# Patient Record
Sex: Female | Born: 1995
Health system: Southern US, Community
[De-identification: ages and names within clinical notes are randomized; demographics above are authoritative.]

## PROBLEM LIST (undated history)

## (undated) DIAGNOSIS — J329 Chronic sinusitis, unspecified: Secondary | ICD-10-CM

## (undated) DIAGNOSIS — K589 Irritable bowel syndrome without diarrhea: Secondary | ICD-10-CM

## (undated) DIAGNOSIS — L309 Dermatitis, unspecified: Secondary | ICD-10-CM

## (undated) DIAGNOSIS — J189 Pneumonia, unspecified organism: Secondary | ICD-10-CM

## (undated) DIAGNOSIS — F419 Anxiety disorder, unspecified: Secondary | ICD-10-CM

## (undated) HISTORY — DX: Chronic sinusitis, unspecified: J32.9

## (undated) HISTORY — DX: Dermatitis, unspecified: L30.9

## (undated) HISTORY — PX: MOUTH SURGERY: SHX715

## (undated) HISTORY — DX: Anxiety disorder, unspecified: F41.9

## (undated) HISTORY — DX: Irritable bowel syndrome, unspecified: K58.9

## (undated) HISTORY — DX: Pneumonia, unspecified organism: J18.9

---
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
## 2013-02-01 ENCOUNTER — Other Ambulatory Visit: Payer: Self-pay | Admitting: Family Medicine

## 2013-02-01 NOTE — Telephone Encounter (Signed)
IS THIS OK 

## 2013-08-01 ENCOUNTER — Ambulatory Visit: Payer: Self-pay | Admitting: Family Medicine

## 2013-08-10 ENCOUNTER — Ambulatory Visit: Payer: Self-pay | Admitting: Family Medicine

## 2013-09-06 ENCOUNTER — Encounter: Payer: Self-pay | Admitting: Family Medicine

## 2013-09-15 ENCOUNTER — Other Ambulatory Visit: Payer: Self-pay | Admitting: Family Medicine

## 2013-10-12 ENCOUNTER — Other Ambulatory Visit: Payer: Self-pay | Admitting: Family Medicine

## 2014-01-08 ENCOUNTER — Ambulatory Visit (INDEPENDENT_AMBULATORY_CARE_PROVIDER_SITE_OTHER): Payer: BC Managed Care – PPO | Admitting: Family Medicine

## 2014-01-08 ENCOUNTER — Encounter: Payer: Self-pay | Admitting: Family Medicine

## 2014-01-08 VITALS — BP 110/70 | HR 68 | Ht 65.5 in | Wt 138.0 lb

## 2014-01-08 DIAGNOSIS — Z0000 Encounter for general adult medical examination without abnormal findings: Secondary | ICD-10-CM

## 2014-01-08 DIAGNOSIS — Z23 Encounter for immunization: Secondary | ICD-10-CM

## 2014-01-08 DIAGNOSIS — F410 Panic disorder [episodic paroxysmal anxiety] without agoraphobia: Secondary | ICD-10-CM

## 2014-01-08 MED ORDER — ESCITALOPRAM OXALATE 20 MG PO TABS: ORAL_TABLET | ORAL | Status: DC

## 2014-01-09 ENCOUNTER — Encounter: Payer: Self-pay | Admitting: Family Medicine

## 2014-01-09 NOTE — Progress Notes (Signed)
   Subjective:    Patient ID: Amy Velasquez, female    DOB: 1996-02-05, 18 y.o.   MRN: [id number]  HPI She is here for a complete examination. She presently is on birth control and doing well on this. She is in college and has a school sponsored trip to [place] where she will be doing backpacking. She is really looking forward to this. She continues on Lexapro. She did try decreasing her Lexapro but after several months found that her anxiety had returned and she is now back on 20 mg and seems to be doing quite nicely on this. She has no other concerns or complaints.   Review of Systems  All other systems reviewed and are negative.      Objective:   Physical Exam alert and in no distress. Tympanic membranes and canals are normal. Throat is clear. Tonsils are normal. Neck is supple without adenopathy or thyromegaly. Cardiac exam shows a regular sinus rhythm without murmurs or gallops. Lungs are clear to auscultation. Abdominal exam shows no masses or tenderness with normal bowel sounds      Assessment & Plan:  Panic disorder - Plan: escitalopram (LEXAPRO) 20 MG tablet  Need for prophylactic vaccination and inoculation against viral hepatitis - Plan: Hepatitis A vaccine adult IM  I will continue her on her Lexapro for the time being. Also discussed her trip in regard to immunizations. She will be given a hepatitis A. She will followup with her school in regard to other immunizations that she might need.

## 2014-04-08 ENCOUNTER — Other Ambulatory Visit: Payer: Self-pay | Admitting: Family Medicine

## 2014-10-19 ENCOUNTER — Other Ambulatory Visit: Payer: Self-pay | Admitting: Family Medicine

## 2014-11-14 ENCOUNTER — Ambulatory Visit (INDEPENDENT_AMBULATORY_CARE_PROVIDER_SITE_OTHER): Payer: BLUE CROSS/BLUE SHIELD | Admitting: Family Medicine

## 2014-11-14 VITALS — BP 90/60 | HR 86 | Temp 98.5°F | Wt 143.0 lb

## 2014-11-14 DIAGNOSIS — S060X0A Concussion without loss of consciousness, initial encounter: Secondary | ICD-10-CM

## 2014-11-14 NOTE — Patient Instructions (Signed)
Concussion A concussion, or closed-head injury, is a brain injury caused by a direct blow to the head or by a quick and sudden movement (jolt) of the head or neck. Concussions are usually not life-threatening. Even so, the effects of a concussion can be serious. If you have had a concussion before, you are more likely to experience concussion-like symptoms after a direct blow to the head.  CAUSES  Direct blow to the head, such as from running into another player during a soccer game, being hit in a fight, or hitting your head on a hard surface.  A jolt of the head or neck that causes the brain to move back and forth inside the skull, such as in a car crash. SIGNS AND SYMPTOMS The signs of a concussion can be hard to notice. Early on, they may be missed by you, family members, and health care providers. You may look fine but act or feel differently. Symptoms are usually temporary, but they may last for days, weeks, or even longer. Some symptoms may appear right away while others may not show up for hours or days. Every head injury is different. Symptoms include:  Mild to moderate headaches that will not go away.  A feeling of pressure inside your head.  Having more trouble than usual:  Learning or remembering things you have heard.  Answering questions.  Paying attention or concentrating.  Organizing daily tasks.  Making decisions and solving problems.  Slowness in thinking, acting or reacting, speaking, or reading.  Getting lost or being easily confused.  Feeling tired all the time or lacking energy (fatigued).  Feeling drowsy.  Sleep disturbances.  Sleeping more than usual.  Sleeping less than usual.  Trouble falling asleep.  Trouble sleeping (insomnia).  Loss of balance or feeling lightheaded or dizzy.  Nausea or vomiting.  Numbness or tingling.  Increased sensitivity to:  Sounds.  Lights.  Distractions.  Vision problems or eyes that tire  easily.  Diminished sense of taste or smell.  Ringing in the ears.  Mood changes such as feeling sad or anxious.  Becoming easily irritated or angry for little or no reason.  Lack of motivation.  Seeing or hearing things other people do not see or hear (hallucinations). DIAGNOSIS Your health care provider can usually diagnose a concussion based on a description of your injury and symptoms. He or she will ask whether you passed out (lost consciousness) and whether you are having trouble remembering events that happened right before and during your injury. Your evaluation might include:  A brain scan to look for signs of injury to the brain. Even if the test shows no injury, you may still have a concussion.  Blood tests to be sure other problems are not present. TREATMENT  Concussions are usually treated in an emergency department, in urgent care, or at a clinic. You may need to stay in the hospital overnight for further treatment.  Tell your health care provider if you are taking any medicines, including prescription medicines, over-the-counter medicines, and natural remedies. Some medicines, such as blood thinners (anticoagulants) and aspirin, may increase the chance of complications. Also tell your health care provider whether you have had alcohol or are taking illegal drugs. This information may affect treatment.  Your health care provider will send you home with important instructions to follow.  How fast you will recover from a concussion depends on many factors. These factors include how severe your concussion is, what part of your brain was injured, your  age, and how healthy you were before the concussion.  Most people with mild injuries recover fully. Recovery can take time. In general, recovery is slower in older persons. Also, persons who have had a concussion in the past or have other medical problems may find that it takes longer to recover from their current injury. HOME  CARE INSTRUCTIONS General Instructions  Carefully follow the directions your health care provider gave you.  Only take over-the-counter or prescription medicines for pain, discomfort, or fever as directed by your health care provider.  Take only those medicines that your health care provider has approved.  Do not drink alcohol until your health care provider says you are well enough to do so. Alcohol and certain other drugs may slow your recovery and can put you at risk of further injury.  If it is harder than usual to remember things, write them down.  If you are easily distracted, try to do one thing at a time. For example, do not try to watch TV while fixing dinner.  Talk with family members or close friends when making important decisions.  Keep all follow-up appointments. Repeated evaluation of your symptoms is recommended for your recovery.  Watch your symptoms and tell others to do the same. Complications sometimes occur after a concussion. Older adults with a brain injury may have a higher risk of serious complications, such as a blood clot on the brain.  Tell your teachers, school nurse, school counselor, coach, athletic trainer, or work Freight forwarder about your injury, symptoms, and restrictions. Tell them about what you can or cannot do. They should watch for:  Increased problems with attention or concentration.  Increased difficulty remembering or learning new information.  Increased time needed to complete tasks or assignments.  Increased irritability or decreased ability to cope with stress.  Increased symptoms.  Rest. Rest helps the brain to heal. Make sure you:  Get plenty of sleep at night. Avoid staying up late at night.  Keep the same bedtime hours on weekends and weekdays.  Rest during the day. Take daytime naps or rest breaks when you feel tired.  Limit activities that require a lot of thought or concentration. These include:  Doing homework or job-related  work.  Watching TV.  Working on the computer.  Avoid any situation where there is potential for another head injury (football, hockey, soccer, basketball, martial arts, downhill snow sports and horseback riding). Your condition will get worse every time you experience a concussion. You should avoid these activities until you are evaluated by the appropriate follow-up health care providers. Returning To Your Regular Activities You will need to return to your normal activities slowly, not all at once. You must give your body and brain enough time for recovery.  Do not return to sports or other athletic activities until your health care provider tells you it is safe to do so.  Ask your health care provider when you can drive, ride a bicycle, or operate heavy machinery. Your ability to react may be slower after a brain injury. Never do these activities if you are dizzy.  Ask your health care provider about when you can return to work or school. Preventing Another Concussion It is very important to avoid another brain injury, especially before you have recovered. In rare cases, another injury can lead to permanent brain damage, brain swelling, or death. The risk of this is greatest during the first 7-10 days after a head injury. Avoid injuries by:  Wearing a seat  belt when riding in a car.  Drinking alcohol only in moderation.  Wearing a helmet when biking, skiing, skateboarding, skating, or doing similar activities.  Avoiding activities that could lead to a second concussion, such as contact or recreational sports, until your health care provider says it is okay.  Taking safety measures in your home.  Remove clutter and tripping hazards from floors and stairways.  Use grab bars in bathrooms and handrails by stairs.  Place non-slip mats on floors and in bathtubs.  Improve lighting in dim areas. SEEK MEDICAL CARE IF:  You have increased problems paying attention or  concentrating.  You have increased difficulty remembering or learning new information.  You need more time to complete tasks or assignments than before.  You have increased irritability or decreased ability to cope with stress.  You have more symptoms than before. Seek medical care if you have any of the following symptoms for more than 2 weeks after your injury:  Lasting (chronic) headaches.  Dizziness or balance problems.  Nausea.  Vision problems.  Increased sensitivity to noise or light.  Depression or mood swings.  Anxiety or irritability.  Memory problems.  Difficulty concentrating or paying attention.  Sleep problems.  Feeling tired all the time. SEEK IMMEDIATE MEDICAL CARE IF:  You have severe or worsening headaches. These may be a sign of a blood clot in the brain.  You have weakness (even if only in one hand, leg, or part of the face).  You have numbness.  You have decreased coordination.  You vomit repeatedly.  You have increased sleepiness.  One pupil is larger than the other.  You have convulsions.  You have slurred speech.  You have increased confusion. This may be a sign of a blood clot in the brain.  You have increased restlessness, agitation, or irritability.  You are unable to recognize people or places.  You have neck pain.  It is difficult to wake you up.  You have unusual behavior changes.  You lose consciousness. MAKE SURE YOU:  Understand these instructions.  Will watch your condition.  Will get help right away if you are not doing well or get worse. Document Released: 07/03/2003 Document Revised: 04/17/2013 Document Reviewed: 11/02/2012 [facility] Patient Information 2015 [place], [place]. This information is not intended to replace advice given to you by your health care provider. Make sure you discuss any questions you have with your health care provider. Can take Tylenol as needed for the headache. Main issues are  worsening headache. Protracted vomiting. Change in orientation specifically person, place and time

## 2014-11-14 NOTE — Progress Notes (Signed)
   Subjective:    Patient ID: Amy Velasquez, female    DOB: 02/10/1996, 19 y.o.   MRN: [id number]  HPI She is here for consult concerning memory loss. She has a history of having URI symptoms with nasal and ear congestion and slight dizziness. This morning she got up and decided not to go to work. She did go into the shower. She apparently did use a hair conditioner and the next thing she remembers is being in bed with her towel wrapped around her. She apparently was able to text a friend and call her roommate. Roommate noted that she was repeating herself a lot and quite confused. She did complain of some slight left-sided head discomfort. She now remembers vaguely having some slight nausea and falling over the toilet. Presently she is having slight head pressure and left-sided pain on palpation and slight nausea. No blurred vision, double vision, weakness, falling to one side.   Review of Systems     Objective:   Physical Exam Alert and in no distress. EOMI. Other cranial nerves grossly intact. Cerebellar testing negative.       Assessment & Plan:  Concussion without loss of consciousness, initial encounter  I think she had a concussion with no loss of consciousness but loss of memory. I think she became nauseous and then a vasovagal response causing her to fall over and hit her head but not lose consciousness. She does remember some of the events in and around the head injury. Presently she is essentially having only minor head pressure and headache to the extent that normally she would need even take anything for this. I explained this in detail to her and her mother. Explained that a CT scan at this point would not necessarily be appropriate. Discussed proper care and gave instructions concerning Tylenol use. Also discussed worsening headache, loss of consciousness and protracted vomiting. They will call if further difficulty. Approximately 25 minutes, greater than 50% spent in  counseling and coordination of care.

## 2014-12-24 ENCOUNTER — Encounter: Payer: Self-pay | Admitting: Family Medicine

## 2014-12-24 ENCOUNTER — Ambulatory Visit (INDEPENDENT_AMBULATORY_CARE_PROVIDER_SITE_OTHER): Payer: BLUE CROSS/BLUE SHIELD | Admitting: Family Medicine

## 2014-12-24 VITALS — BP 110/70 | HR 76 | Wt 135.8 lb

## 2014-12-24 DIAGNOSIS — S9032XA Contusion of left foot, initial encounter: Secondary | ICD-10-CM | POA: Diagnosis not present

## 2014-12-24 DIAGNOSIS — Z23 Encounter for immunization: Secondary | ICD-10-CM | POA: Diagnosis not present

## 2014-12-24 NOTE — Progress Notes (Signed)
   Subjective:    Patient ID: Amy Velasquez, female    DOB: Apr 28, 1995, 18 y.o.   MRN: [id number]  HPI She complains of left foot pain for about the last week. Apparently while dancing in her kitchen at school she landed on the right foot and felt some pain. It has continued to bother her since then.   Review of Systems     Objective:   Physical Exam Full motion of the left foot without pain. A 1 cm slightly erythematous and tender to palpation lesion is noted over the distal fifth metatarsal. No bone tenderness noticed.      Assessment & Plan:  Need for prophylactic vaccination and inoculation against influenza - Plan: Flu Vaccine QUAD 36+ mos IM  Foot contusion, left, initial encounter I explained that she probably did step on something causing a foot contusion in that area. Recommend supportive care including continuing to use her Birkenstocks or possibly use to help spread the wait out. He is comfortable with that approach.

## 2015-01-19 ENCOUNTER — Other Ambulatory Visit: Payer: Self-pay | Admitting: Family Medicine

## 2015-01-20 NOTE — Telephone Encounter (Signed)
Is this okay?

## 2015-04-25 ENCOUNTER — Other Ambulatory Visit: Payer: Self-pay | Admitting: Family Medicine

## 2015-04-25 NOTE — Telephone Encounter (Signed)
Is this okay to refill? 

## 2015-05-12 ENCOUNTER — Other Ambulatory Visit: Payer: Self-pay | Admitting: Family Medicine

## 2015-05-13 ENCOUNTER — Other Ambulatory Visit: Payer: Self-pay | Admitting: *Deleted

## 2015-05-13 MED ORDER — NORGESTIM-ETH ESTRAD TRIPHASIC 0.18/0.215/0.25 MG-35 MCG PO TABS: 1.0000 | ORAL_TABLET | Freq: Every day | ORAL | Status: DC

## 2015-05-21 ENCOUNTER — Ambulatory Visit (INDEPENDENT_AMBULATORY_CARE_PROVIDER_SITE_OTHER): Payer: BLUE CROSS/BLUE SHIELD | Admitting: Family Medicine

## 2015-05-21 ENCOUNTER — Encounter: Payer: Self-pay | Admitting: Family Medicine

## 2015-05-21 VITALS — BP 106/72 | HR 74 | Wt 140.0 lb

## 2015-05-21 DIAGNOSIS — F439 Reaction to severe stress, unspecified: Secondary | ICD-10-CM

## 2015-05-21 DIAGNOSIS — E739 Lactose intolerance, unspecified: Secondary | ICD-10-CM

## 2015-05-21 DIAGNOSIS — Z658 Other specified problems related to psychosocial circumstances: Secondary | ICD-10-CM | POA: Diagnosis not present

## 2015-05-21 NOTE — Progress Notes (Signed)
   Subjective:    Patient ID: Amy Velasquez, female    DOB: 03/16/1996, 20 y.o.   MRN: [id number]  HPI She is here for evaluation of a one-month history of intermittent difficulty with waves of nausea and on at least 4 occasions vomiting with this. She initially could not relate this to anything in particular but thinks that now it could possibly be related to the fact that she has lactose intolerance and on a few occasions the nausea was associated with this. Usually with lactose intolerance she gets diarrhea. Other stresses include the fact that she is now a senior and is not sure what she'll be doing next her. Her menses have been normal. She has no other concerns or complaints.   Review of Systems     Objective:   Physical Exam Alert and in no distress. Abdominal exam shows normal bowel sounds without masses or tenderness.       Assessment & Plan:  Lactose intolerance in adult  Stress Explained that her symptoms are probably multifactorial but certainly lactose intolerance plays a role. Recommend she keep track of her symptoms to see if there is a pattern other than lactose. Explained the fact that the stress from being in her senior year's early plays a role in this.

## 2015-07-29 DIAGNOSIS — F419 Anxiety disorder, unspecified: Secondary | ICD-10-CM | POA: Diagnosis not present

## 2015-08-06 ENCOUNTER — Encounter: Payer: Self-pay | Admitting: Family Medicine

## 2015-08-06 ENCOUNTER — Ambulatory Visit (INDEPENDENT_AMBULATORY_CARE_PROVIDER_SITE_OTHER): Payer: BLUE CROSS/BLUE SHIELD | Admitting: Family Medicine

## 2015-08-06 VITALS — BP 110/70 | HR 66 | Temp 97.6°F | Wt 142.0 lb

## 2015-08-06 DIAGNOSIS — J209 Acute bronchitis, unspecified: Secondary | ICD-10-CM

## 2015-08-06 MED ORDER — CLARITHROMYCIN 500 MG PO TABS: 500.0000 mg | ORAL_TABLET | Freq: Two times a day (BID) | ORAL | Status: DC

## 2015-08-06 NOTE — Progress Notes (Signed)
   Subjective:    Patient ID: Amy Velasquez, female    DOB: 08/21/1995, 20 y.o.   MRN: [id number]  HPI She has a 2-1/2 week history this started with cough, slight fatigue and fever. The cough has continued but she has no sore throat, earache, sneezing, itchy watery eyes, rhinorrhea. No history of allergies and she does not smoke   Review of Systems     Objective:   Physical Exam Alert and in no distress. Tympanic membranes and canals are normal. Pharyngeal area is normal. Neck is supple without adenopathy or thyromegaly. Cardiac exam shows a regular sinus rhythm without murmurs or gallops. Lungs are clear to auscultation.        Assessment & Plan:  Acute bronchitis, unspecified organism - Plan: clarithromycin (BIAXIN) 500 MG tablet  Instructed to call if not entirely better when she finishes the antibiotic

## 2015-08-19 DIAGNOSIS — F419 Anxiety disorder, unspecified: Secondary | ICD-10-CM | POA: Diagnosis not present

## 2015-08-25 ENCOUNTER — Telehealth: Payer: Self-pay | Admitting: Family Medicine

## 2015-08-25 MED ORDER — AMOXICILLIN-POT CLAVULANATE 875-125 MG PO TABS: 1.0000 | ORAL_TABLET | Freq: Two times a day (BID) | ORAL | Status: DC

## 2015-08-25 NOTE — Telephone Encounter (Signed)
She should have finished the antibiotic by now. Let her know that I called a different one in and she should throw away any that she has left of the present one

## 2015-08-25 NOTE — Telephone Encounter (Signed)
Pt called and stated that she is feeling worse. She now has congestion in nose and chest. She also has a metallic test in her mouth. She wanted to make sure that she should continue to take to medication she was given. Please advise. Pt can be reached at [phone].

## 2015-08-26 NOTE — Telephone Encounter (Signed)
Patient informed and verbalized understanding

## 2015-08-27 ENCOUNTER — Other Ambulatory Visit: Payer: Self-pay | Admitting: Family Medicine

## 2015-08-27 NOTE — Telephone Encounter (Signed)
Is this okay to refill? 

## 2015-09-11 DIAGNOSIS — F419 Anxiety disorder, unspecified: Secondary | ICD-10-CM | POA: Diagnosis not present

## 2015-10-08 DIAGNOSIS — F419 Anxiety disorder, unspecified: Secondary | ICD-10-CM | POA: Diagnosis not present

## 2015-11-05 DIAGNOSIS — F419 Anxiety disorder, unspecified: Secondary | ICD-10-CM | POA: Diagnosis not present

## 2015-11-25 DIAGNOSIS — F419 Anxiety disorder, unspecified: Secondary | ICD-10-CM | POA: Diagnosis not present

## 2015-12-03 DIAGNOSIS — Z733 Stress, not elsewhere classified: Secondary | ICD-10-CM | POA: Diagnosis not present

## 2015-12-03 DIAGNOSIS — F419 Anxiety disorder, unspecified: Secondary | ICD-10-CM | POA: Diagnosis not present

## 2015-12-03 DIAGNOSIS — R109 Unspecified abdominal pain: Secondary | ICD-10-CM | POA: Diagnosis not present

## 2015-12-03 DIAGNOSIS — R197 Diarrhea, unspecified: Secondary | ICD-10-CM | POA: Diagnosis not present

## 2015-12-09 DIAGNOSIS — F419 Anxiety disorder, unspecified: Secondary | ICD-10-CM | POA: Diagnosis not present

## 2015-12-11 DIAGNOSIS — Z113 Encounter for screening for infections with a predominantly sexual mode of transmission: Secondary | ICD-10-CM | POA: Diagnosis not present

## 2015-12-11 DIAGNOSIS — R109 Unspecified abdominal pain: Secondary | ICD-10-CM | POA: Diagnosis not present

## 2015-12-11 DIAGNOSIS — R197 Diarrhea, unspecified: Secondary | ICD-10-CM | POA: Diagnosis not present

## 2015-12-11 DIAGNOSIS — Z0000 Encounter for general adult medical examination without abnormal findings: Secondary | ICD-10-CM | POA: Diagnosis not present

## 2015-12-11 DIAGNOSIS — F419 Anxiety disorder, unspecified: Secondary | ICD-10-CM | POA: Diagnosis not present

## 2016-01-14 DIAGNOSIS — F419 Anxiety disorder, unspecified: Secondary | ICD-10-CM | POA: Diagnosis not present

## 2016-01-21 ENCOUNTER — Telehealth: Payer: Self-pay | Admitting: Family Medicine

## 2016-01-21 MED ORDER — NORGESTIM-ETH ESTRAD TRIPHASIC 0.18/0.215/0.25 MG-35 MCG PO TABS: 1.0000 | ORAL_TABLET | Freq: Every day | ORAL | 0 refills | Status: DC

## 2016-01-21 NOTE — Telephone Encounter (Signed)
[facility]   [address]  Tri previfem tablet #[id number]

## 2016-01-21 NOTE — Telephone Encounter (Signed)
rx called to pharmacy 

## 2016-01-21 NOTE — Telephone Encounter (Signed)
Okay to refill for 3 months 

## 2016-02-03 ENCOUNTER — Other Ambulatory Visit: Payer: Self-pay | Admitting: Family Medicine

## 2016-02-03 NOTE — Telephone Encounter (Signed)
This should've been called in on September 27

## 2016-02-03 NOTE — Telephone Encounter (Signed)
Is this okay to refill? 

## 2016-02-04 DIAGNOSIS — F419 Anxiety disorder, unspecified: Secondary | ICD-10-CM | POA: Diagnosis not present

## 2016-02-09 ENCOUNTER — Other Ambulatory Visit: Payer: Self-pay | Admitting: Family Medicine

## 2016-02-09 NOTE — Telephone Encounter (Signed)
Is this okay to refill? 

## 2016-02-09 NOTE — Telephone Encounter (Signed)
Needs a follow-up appointment. Give her enough to cover.

## 2016-02-22 DIAGNOSIS — F419 Anxiety disorder, unspecified: Secondary | ICD-10-CM | POA: Diagnosis not present

## 2016-03-02 DIAGNOSIS — F419 Anxiety disorder, unspecified: Secondary | ICD-10-CM | POA: Diagnosis not present

## 2016-03-16 DIAGNOSIS — F419 Anxiety disorder, unspecified: Secondary | ICD-10-CM | POA: Diagnosis not present

## 2016-03-21 ENCOUNTER — Other Ambulatory Visit: Payer: Self-pay | Admitting: Family Medicine

## 2016-03-22 NOTE — Telephone Encounter (Signed)
Is this okay to refill? 

## 2016-03-22 NOTE — Telephone Encounter (Signed)
She is going to need an appointment

## 2016-03-30 DIAGNOSIS — F419 Anxiety disorder, unspecified: Secondary | ICD-10-CM | POA: Diagnosis not present

## 2016-04-13 ENCOUNTER — Other Ambulatory Visit: Payer: Self-pay | Admitting: Family Medicine

## 2016-04-13 DIAGNOSIS — F419 Anxiety disorder, unspecified: Secondary | ICD-10-CM | POA: Diagnosis not present

## 2016-04-13 NOTE — Telephone Encounter (Signed)
Left message for pt to call and make appointment for med check or cpe for her B/C

## 2016-04-13 NOTE — Telephone Encounter (Signed)
Renew but have her come in for an appointment within the next month or 2

## 2016-04-13 NOTE — Telephone Encounter (Signed)
Is this okay to refill? 

## 2016-05-05 DIAGNOSIS — F419 Anxiety disorder, unspecified: Secondary | ICD-10-CM | POA: Diagnosis not present

## 2016-05-11 DIAGNOSIS — F419 Anxiety disorder, unspecified: Secondary | ICD-10-CM | POA: Diagnosis not present

## 2016-05-26 DIAGNOSIS — F419 Anxiety disorder, unspecified: Secondary | ICD-10-CM | POA: Diagnosis not present

## 2016-06-09 DIAGNOSIS — F419 Anxiety disorder, unspecified: Secondary | ICD-10-CM | POA: Diagnosis not present

## 2016-06-16 ENCOUNTER — Other Ambulatory Visit: Payer: Self-pay | Admitting: Family Medicine

## 2016-06-16 NOTE — Telephone Encounter (Signed)
Pt has appointment 

## 2016-06-16 NOTE — Telephone Encounter (Signed)
Schedule her for a visit.

## 2016-06-16 NOTE — Telephone Encounter (Signed)
Is this okay to refill? 

## 2016-06-23 DIAGNOSIS — F419 Anxiety disorder, unspecified: Secondary | ICD-10-CM | POA: Diagnosis not present

## 2016-06-28 ENCOUNTER — Encounter: Payer: BLUE CROSS/BLUE SHIELD | Admitting: Family Medicine

## 2016-06-28 ENCOUNTER — Telehealth: Payer: Self-pay

## 2016-06-28 DIAGNOSIS — Z0000 Encounter for general adult medical examination without abnormal findings: Secondary | ICD-10-CM

## 2016-06-28 NOTE — Telephone Encounter (Signed)
No Lexapro until she sets up another appointment

## 2016-06-28 NOTE — Telephone Encounter (Signed)
Left message on VM pt missed a appointment a physical and Dr.Lalonde said he couldn't refill her meds till she has an appointment

## 2016-06-28 NOTE — Telephone Encounter (Signed)

## 2016-07-06 ENCOUNTER — Other Ambulatory Visit: Payer: Self-pay | Admitting: Family Medicine

## 2016-07-06 NOTE — Telephone Encounter (Signed)
Is this okay to refill? 

## 2016-07-08 ENCOUNTER — Other Ambulatory Visit: Payer: Self-pay | Admitting: Family Medicine

## 2016-07-08 NOTE — Telephone Encounter (Signed)
Is this okay to refill she has appointment 3/26

## 2016-07-08 NOTE — Telephone Encounter (Signed)
Give her a one-month supply

## 2016-07-09 ENCOUNTER — Encounter: Payer: Self-pay | Admitting: Family Medicine

## 2016-07-14 DIAGNOSIS — F419 Anxiety disorder, unspecified: Secondary | ICD-10-CM | POA: Diagnosis not present

## 2016-07-19 ENCOUNTER — Encounter: Payer: Self-pay | Admitting: Family Medicine

## 2016-07-19 ENCOUNTER — Ambulatory Visit (INDEPENDENT_AMBULATORY_CARE_PROVIDER_SITE_OTHER): Payer: BLUE CROSS/BLUE SHIELD | Admitting: Family Medicine

## 2016-07-19 VITALS — BP 110/60 | HR 78 | Ht 66.5 in | Wt 144.2 lb

## 2016-07-19 DIAGNOSIS — E739 Lactose intolerance, unspecified: Secondary | ICD-10-CM | POA: Insufficient documentation

## 2016-07-19 DIAGNOSIS — K589 Irritable bowel syndrome without diarrhea: Secondary | ICD-10-CM

## 2016-07-19 DIAGNOSIS — Z309 Encounter for contraceptive management, unspecified: Secondary | ICD-10-CM | POA: Insufficient documentation

## 2016-07-19 DIAGNOSIS — Z0000 Encounter for general adult medical examination without abnormal findings: Secondary | ICD-10-CM | POA: Diagnosis not present

## 2016-07-19 DIAGNOSIS — F410 Panic disorder [episodic paroxysmal anxiety] without agoraphobia: Secondary | ICD-10-CM | POA: Diagnosis not present

## 2016-07-19 DIAGNOSIS — Z3009 Encounter for other general counseling and advice on contraception: Secondary | ICD-10-CM

## 2016-07-19 LAB — CBC WITH DIFFERENTIAL/PLATELET
Basophils Absolute: 0 cells/uL (ref 0–200)
Basophils Relative: 0 %
Eosinophils Absolute: 152 cells/uL (ref 15–500)
Eosinophils Relative: 2 %
HCT: 37.1 % (ref 35.0–45.0)
Hemoglobin: 12 g/dL (ref 11.7–15.5)
Lymphocytes Relative: 36 %
Lymphs Abs: 2736 cells/uL (ref 850–3900)
MCH: 26.5 pg — ABNORMAL LOW (ref 27.0–33.0)
MCHC: 32.3 g/dL (ref 32.0–36.0)
MCV: 82.1 fL (ref 80.0–100.0)
MPV: 10.3 fL (ref 7.5–12.5)
Monocytes Absolute: 532 cells/uL (ref 200–950)
Monocytes Relative: 7 %
Neutro Abs: 4180 cells/uL (ref 1500–7800)
Neutrophils Relative %: 55 %
Platelets: 295 10*3/uL (ref 140–400)
RBC: 4.52 MIL/uL (ref 3.80–5.10)
RDW: 13.5 % (ref 11.0–15.0)
WBC: 7.6 10*3/uL (ref 4.0–10.5)

## 2016-07-19 LAB — POCT URINALYSIS DIPSTICK
Bilirubin, UA: NEGATIVE
Blood, UA: NEGATIVE
Glucose, UA: NEGATIVE
Ketones, UA: NEGATIVE
Leukocytes, UA: NEGATIVE
Nitrite, UA: NEGATIVE
Protein, UA: NEGATIVE
Spec Grav, UA: 1.025 (ref 1.030–1.035)
Urobilinogen, UA: NEGATIVE (ref ?–2.0)
pH, UA: 6 (ref 5.0–8.0)

## 2016-07-19 LAB — COMPREHENSIVE METABOLIC PANEL
ALT: 11 U/L (ref 6–29)
AST: 19 U/L (ref 10–30)
Albumin: 3.7 g/dL (ref 3.6–5.1)
Alkaline Phosphatase: 49 U/L (ref 33–115)
BUN: 12 mg/dL (ref 7–25)
CO2: 24 mmol/L (ref 20–31)
Calcium: 9.1 mg/dL (ref 8.6–10.2)
Chloride: 105 mmol/L (ref 98–110)
Creat: 0.94 mg/dL (ref 0.50–1.10)
Glucose, Bld: 76 mg/dL (ref 65–99)
Potassium: 4.2 mmol/L (ref 3.5–5.3)
Sodium: 139 mmol/L (ref 135–146)
Total Bilirubin: 0.4 mg/dL (ref 0.2–1.2)
Total Protein: 6.5 g/dL (ref 6.1–8.1)

## 2016-07-19 LAB — LIPID PANEL
Cholesterol: 179 mg/dL (ref <200)
HDL: 46 mg/dL — ABNORMAL LOW (ref >50)
LDL Cholesterol: 115 mg/dL — ABNORMAL HIGH (ref <100)
Total CHOL/HDL Ratio: 3.9 Ratio (ref <5.0)
Triglycerides: 91 mg/dL (ref <150)
VLDL: 18 mg/dL (ref <30)

## 2016-07-19 MED ORDER — ESCITALOPRAM OXALATE 20 MG PO TABS: 20.0000 mg | ORAL_TABLET | Freq: Every day | ORAL | 3 refills | Status: DC

## 2016-07-19 MED ORDER — NORGESTIM-ETH ESTRAD TRIPHASIC 0.18/0.215/0.25 MG-35 MCG PO TABS: 1.0000 | ORAL_TABLET | Freq: Every day | ORAL | 1 refills | Status: DC

## 2016-07-19 NOTE — Progress Notes (Signed)
Subjective:    Patient ID: Amy Velasquez, female    DOB: 05/12/1995, 21 y.o.   MRN: [id number]  HPI She is here for complete examination. She has a history of panic disorder and is doing quite nicely on Lexapro. She did try to taper off the medication but her symptoms recurred. The present time she would like to continue. She is involved in a [employer] campaign. She does have underlying IBS and notes that this is definitely related to the stress that she is under. She also has lactose intolerance and does stay away from food she knows causes trouble. She is on birth control to help with her cycles. Presently she is not sexually active. She plans to follow-up with OB/GYN to get a Pap smear. Has no underlying allergies .She has no other concerns or complaints. Family and social history as well as health maintenance and immunizations were reviewed. She does plan on going to grad school and will be taking the GRE in the next several days.  Review of Systems  All other systems reviewed and are negative.      Objective:   Physical Exam BP 110/60   Pulse 78   Ht 5' 6.5" (1.689 m)   Wt 144 lb 3.2 oz (65.4 kg)   LMP 07/08/2016 (Exact Date)   SpO2 99%   BMI 22.93 kg/m   General Appearance:    Alert, cooperative, no distress, appears stated age  Head:    Normocephalic, without obvious abnormality, atraumatic  Eyes:    PERRL, conjunctiva/corneas clear, EOM's intact, fundi    benign  Ears:    Normal TM's and external ear canals  Nose:   Nares normal, mucosa normal, no drainage or sinus   tenderness  Throat:   Lips, mucosa, and tongue normal; teeth and gums normal  Neck:   Supple, no lymphadenopathy;  thyroid:  no   enlargement/tenderness/nodules; no carotid   bruit or JVD     Lungs:     Clear to auscultation bilaterally without wheezes, rales or     ronchi; respirations unlabored      Heart:    Regular rate and rhythm, S1 and S2 normal, no murmur, rub   or gallop  Breast Exam:     Deferred to GYN  Abdomen:     Soft, non-tender, nondistended, normoactive bowel sounds,    no masses, no hepatosplenomegaly  Genitalia:    Deferred to GYN     Extremities:   No clubbing, cyanosis or edema  Pulses:   2+ and symmetric all extremities  Skin:   Skin color, texture, turgor normal, no rashes or lesions  Lymph nodes:   Cervical, supraclavicular, and axillary nodes normal  Neurologic:   CNII-XII intact, normal strength, sensation and gait; reflexes 2+ and symmetric throughout          Psych:   Normal mood, affect, hygiene and grooming.          Assessment & Plan:  Routine general medical examination at a health care facility - Plan: POCT Urinalysis Dipstick, CBC with Differential/Platelet, Comprehensive metabolic panel, Lipid panel  Lactose intolerance in adult  Panic disorder - Plan: escitalopram (LEXAPRO) 20 MG tablet  Irritable bowel syndrome, unspecified type  Encounter for other general counseling or advice on contraception - Plan: Norgestimate-Ethinyl Estradiol Triphasic (TRI-PREVIFEM) 0.18/0.215/0.25 MG-35 MCG tablet Will do routine screening to have some data on board. She has a good feel for handling her lactose intolerance. I will continue on Lexapro at least  another year. She seems to have good handle on stress and her IBS. She will follow-up with OB/GYN concerning getting her Pap.

## 2016-08-03 ENCOUNTER — Telehealth: Payer: Self-pay

## 2016-08-03 DIAGNOSIS — Z3009 Encounter for other general counseling and advice on contraception: Secondary | ICD-10-CM

## 2016-08-03 MED ORDER — NORGESTIM-ETH ESTRAD TRIPHASIC 0.18/0.215/0.25 MG-35 MCG PO TABS: 1.0000 | ORAL_TABLET | Freq: Every day | ORAL | 1 refills | Status: DC

## 2016-08-03 NOTE — Telephone Encounter (Signed)
error 

## 2016-08-04 ENCOUNTER — Telehealth: Payer: Self-pay

## 2016-08-04 DIAGNOSIS — Z3009 Encounter for other general counseling and advice on contraception: Secondary | ICD-10-CM

## 2016-08-04 MED ORDER — NORGESTIM-ETH ESTRAD TRIPHASIC 0.18/0.215/0.25 MG-35 MCG PO TABS: 1.0000 | ORAL_TABLET | Freq: Every day | ORAL | 0 refills | Status: DC

## 2016-08-04 NOTE — Telephone Encounter (Signed)
My notes that she was going to follow-up with her OB concerning getting a Pap. Find out what happened with that.

## 2016-08-04 NOTE — Telephone Encounter (Signed)
Left VM to please call me back

## 2016-08-04 NOTE — Telephone Encounter (Signed)
[facility] for 90 day supply of Tri- Previfem.Amy Velasquez

## 2016-08-04 NOTE — Telephone Encounter (Signed)
She has not made appointment yet but could you refill just one more time

## 2016-08-12 ENCOUNTER — Telehealth: Payer: Self-pay

## 2016-08-12 NOTE — Telephone Encounter (Signed)
I know just wanted you to be aware .

## 2016-08-12 NOTE — Telephone Encounter (Signed)
Ok. Just forwarded to you per protocol of medication refills. Amy Velasquez

## 2016-08-12 NOTE — Telephone Encounter (Signed)
[facility]ax [facility] day supply [facility] [address] of Tri-previfem. Trixie Rude

## 2016-08-12 NOTE — Telephone Encounter (Signed)
Lurena Joiner she was to get an obgyn see last note

## 2016-09-23 ENCOUNTER — Telehealth: Payer: Self-pay

## 2016-09-23 DIAGNOSIS — Z3009 Encounter for other general counseling and advice on contraception: Secondary | ICD-10-CM

## 2016-09-23 MED ORDER — NORGESTIM-ETH ESTRAD TRIPHASIC 0.18/0.215/0.25 MG-35 MCG PO TABS: 1.0000 | ORAL_TABLET | Freq: Every day | ORAL | 0 refills | Status: DC

## 2016-09-23 NOTE — Telephone Encounter (Signed)
Pt needs refill on her birth control- she states she has decided not to go to OB/GYN for pap smear and has scheduled PAP smear with you. Can you give a refill to last until 10/07/2016?  [facility] [address] CB # [phone]. Trixie Rude/RLB

## 2016-09-29 ENCOUNTER — Other Ambulatory Visit: Payer: Self-pay | Admitting: Family Medicine

## 2016-09-29 DIAGNOSIS — Z3009 Encounter for other general counseling and advice on contraception: Secondary | ICD-10-CM

## 2016-10-07 ENCOUNTER — Encounter: Payer: Self-pay | Admitting: Family Medicine

## 2016-10-07 ENCOUNTER — Ambulatory Visit (INDEPENDENT_AMBULATORY_CARE_PROVIDER_SITE_OTHER): Payer: BLUE CROSS/BLUE SHIELD | Admitting: Family Medicine

## 2016-10-07 ENCOUNTER — Other Ambulatory Visit (HOSPITAL_COMMUNITY)
Admission: RE | Admit: 2016-10-07 | Discharge: 2016-10-07 | Disposition: A | Discharge status: Home / Self Care | Payer: BLUE CROSS/BLUE SHIELD | Source: Ambulatory Visit | Attending: Family Medicine | Admitting: Family Medicine

## 2016-10-07 VITALS — BP 110/70 | HR 77 | Ht 67.0 in | Wt 146.0 lb

## 2016-10-07 DIAGNOSIS — Z298 Encounter for other specified prophylactic measures: Secondary | ICD-10-CM | POA: Diagnosis not present

## 2016-10-07 DIAGNOSIS — Z01419 Encounter for gynecological examination (general) (routine) without abnormal findings: Secondary | ICD-10-CM | POA: Insufficient documentation

## 2016-10-07 DIAGNOSIS — Z124 Encounter for screening for malignant neoplasm of cervix: Secondary | ICD-10-CM

## 2016-10-07 MED ORDER — ACETAZOLAMIDE 125 MG PO TABS: 125.0000 mg | ORAL_TABLET | Freq: Two times a day (BID) | ORAL | 0 refills | Status: DC

## 2016-10-07 NOTE — Progress Notes (Signed)
   Subjective:    Patient ID: Amy Velasquez, female    DOB: [birth year], 21 y.o.   MRN: [id number]  HPI  she is here to have her Pap. She also is getting ready to go to [place] and will be above 10,000 feet and would like coverage for altiude sickness.   Review of Systems     Objective:   Physical Exam  alert and in no distress. Pelvic exam shows no masse or tenderness. Cervix was normal.       Assessment & Plan:  Screening for cervical cancer - Plan: Cytology - PAP [place]  Altitude sickness prophylaxis - Plan: acetaZOLAMIDE (DIAMOX) 125 MG tablet  she will start taking the medicine when she arrives improve and then take it for the 2 days ater she comes down frm the igh altitude.

## 2016-10-11 LAB — CYTOLOGY - PAP: Diagnosis: NEGATIVE

## 2016-10-18 ENCOUNTER — Telehealth: Payer: Self-pay

## 2016-10-18 DIAGNOSIS — Z3009 Encounter for other general counseling and advice on contraception: Secondary | ICD-10-CM

## 2016-10-18 MED ORDER — NORGESTIM-ETH ESTRAD TRIPHASIC 0.18/0.215/0.25 MG-35 MCG PO TABS: 1.0000 | ORAL_TABLET | Freq: Every day | ORAL | 1 refills | Status: DC

## 2016-10-18 NOTE — Telephone Encounter (Signed)
Sent med to pharmacy  

## 2016-10-18 NOTE — Telephone Encounter (Signed)
Pt request 90 day supply of Tri-Previfem [facility] [address] Trixie Rude/RLB

## 2016-10-19 DIAGNOSIS — F419 Anxiety disorder, unspecified: Secondary | ICD-10-CM | POA: Diagnosis not present

## 2016-10-20 ENCOUNTER — Telehealth: Payer: Self-pay | Admitting: Family Medicine

## 2016-10-20 MED ORDER — CIPROFLOXACIN HCL 500 MG PO TABS: 500.0000 mg | ORAL_TABLET | Freq: Two times a day (BID) | ORAL | 0 refills | Status: DC

## 2016-10-20 NOTE — Telephone Encounter (Signed)
Ok per [place], pt informed

## 2016-10-20 NOTE — Telephone Encounter (Signed)
Pt called & states she forgot to ask Dr. Susann GivensLalonde last week at her appt about getting Ciprofloxacin for her stomach for her trip, states Dr. Susann GivensLalonde gave it to her before and it really helped her, [place]

## 2016-10-20 NOTE — Telephone Encounter (Signed)
Call out Cipro 500mg  BID #10, no refill

## 2016-11-12 DIAGNOSIS — F419 Anxiety disorder, unspecified: Secondary | ICD-10-CM | POA: Diagnosis not present

## 2016-12-24 DIAGNOSIS — F419 Anxiety disorder, unspecified: Secondary | ICD-10-CM | POA: Diagnosis not present

## 2017-01-11 DIAGNOSIS — F419 Anxiety disorder, unspecified: Secondary | ICD-10-CM | POA: Diagnosis not present

## 2017-02-02 DIAGNOSIS — F419 Anxiety disorder, unspecified: Secondary | ICD-10-CM | POA: Diagnosis not present

## 2017-02-16 ENCOUNTER — Other Ambulatory Visit: Payer: Self-pay | Admitting: Family Medicine

## 2017-02-16 DIAGNOSIS — Z3009 Encounter for other general counseling and advice on contraception: Secondary | ICD-10-CM

## 2017-02-24 ENCOUNTER — Other Ambulatory Visit: Payer: Self-pay | Admitting: Family Medicine

## 2017-02-24 ENCOUNTER — Telehealth: Payer: Self-pay | Admitting: Family Medicine

## 2017-02-24 DIAGNOSIS — Z3009 Encounter for other general counseling and advice on contraception: Secondary | ICD-10-CM

## 2017-02-24 NOTE — Telephone Encounter (Signed)
Pt called as pharmacy does not have refill for Tri Sprintec.  Spoke with Tresa EndoKelly gave her verbal.

## 2017-03-04 DIAGNOSIS — F419 Anxiety disorder, unspecified: Secondary | ICD-10-CM | POA: Diagnosis not present

## 2017-03-13 DIAGNOSIS — F419 Anxiety disorder, unspecified: Secondary | ICD-10-CM | POA: Diagnosis not present

## 2017-04-12 DIAGNOSIS — F419 Anxiety disorder, unspecified: Secondary | ICD-10-CM | POA: Diagnosis not present

## 2017-04-28 DIAGNOSIS — F419 Anxiety disorder, unspecified: Secondary | ICD-10-CM | POA: Diagnosis not present

## 2017-05-24 DIAGNOSIS — F419 Anxiety disorder, unspecified: Secondary | ICD-10-CM | POA: Diagnosis not present

## 2017-06-08 DIAGNOSIS — F419 Anxiety disorder, unspecified: Secondary | ICD-10-CM | POA: Diagnosis not present

## 2017-06-29 DIAGNOSIS — Z113 Encounter for screening for infections with a predominantly sexual mode of transmission: Secondary | ICD-10-CM | POA: Diagnosis not present

## 2017-06-29 DIAGNOSIS — Z01419 Encounter for gynecological examination (general) (routine) without abnormal findings: Secondary | ICD-10-CM | POA: Diagnosis not present

## 2017-06-29 DIAGNOSIS — Z124 Encounter for screening for malignant neoplasm of cervix: Secondary | ICD-10-CM | POA: Diagnosis not present

## 2017-06-29 DIAGNOSIS — Z3041 Encounter for surveillance of contraceptive pills: Secondary | ICD-10-CM | POA: Diagnosis not present

## 2017-07-01 DIAGNOSIS — F419 Anxiety disorder, unspecified: Secondary | ICD-10-CM | POA: Diagnosis not present

## 2017-07-14 DIAGNOSIS — R197 Diarrhea, unspecified: Secondary | ICD-10-CM | POA: Diagnosis not present

## 2017-07-14 DIAGNOSIS — E739 Lactose intolerance, unspecified: Secondary | ICD-10-CM | POA: Diagnosis not present

## 2017-07-14 DIAGNOSIS — K580 Irritable bowel syndrome with diarrhea: Secondary | ICD-10-CM | POA: Diagnosis not present

## 2017-07-14 DIAGNOSIS — R109 Unspecified abdominal pain: Secondary | ICD-10-CM | POA: Diagnosis not present

## 2017-07-22 DIAGNOSIS — K580 Irritable bowel syndrome with diarrhea: Secondary | ICD-10-CM | POA: Diagnosis not present

## 2017-07-22 DIAGNOSIS — R109 Unspecified abdominal pain: Secondary | ICD-10-CM | POA: Diagnosis not present

## 2017-07-22 DIAGNOSIS — M545 Low back pain: Secondary | ICD-10-CM | POA: Diagnosis not present

## 2017-07-22 DIAGNOSIS — R197 Diarrhea, unspecified: Secondary | ICD-10-CM | POA: Diagnosis not present

## 2017-07-22 DIAGNOSIS — E739 Lactose intolerance, unspecified: Secondary | ICD-10-CM | POA: Diagnosis not present

## 2017-07-29 DIAGNOSIS — F419 Anxiety disorder, unspecified: Secondary | ICD-10-CM | POA: Diagnosis not present

## 2017-08-04 DIAGNOSIS — F419 Anxiety disorder, unspecified: Secondary | ICD-10-CM | POA: Diagnosis not present

## 2017-08-25 DIAGNOSIS — F419 Anxiety disorder, unspecified: Secondary | ICD-10-CM | POA: Diagnosis not present

## 2017-09-01 ENCOUNTER — Other Ambulatory Visit: Payer: Self-pay | Admitting: Family Medicine

## 2017-09-01 DIAGNOSIS — F410 Panic disorder [episodic paroxysmal anxiety] without agoraphobia: Secondary | ICD-10-CM

## 2017-09-01 NOTE — Telephone Encounter (Signed)
[facility] Please advise KH 

## 2017-09-13 DIAGNOSIS — F419 Anxiety disorder, unspecified: Secondary | ICD-10-CM | POA: Diagnosis not present

## 2017-10-17 DIAGNOSIS — F419 Anxiety disorder, unspecified: Secondary | ICD-10-CM | POA: Diagnosis not present

## 2017-11-03 DIAGNOSIS — L249 Irritant contact dermatitis, unspecified cause: Secondary | ICD-10-CM | POA: Diagnosis not present

## 2017-11-08 DIAGNOSIS — F419 Anxiety disorder, unspecified: Secondary | ICD-10-CM | POA: Diagnosis not present

## 2017-12-05 ENCOUNTER — Other Ambulatory Visit: Payer: Self-pay | Admitting: Family Medicine

## 2017-12-05 DIAGNOSIS — F410 Panic disorder [episodic paroxysmal anxiety] without agoraphobia: Secondary | ICD-10-CM

## 2017-12-05 NOTE — Telephone Encounter (Signed)
Called to advise she needs an med check appt with Dr. Susann GivensLalonde. No answer LVM to call and make appt. KH 12-05-17

## 2017-12-05 NOTE — Telephone Encounter (Signed)
[facility]

## 2017-12-07 DIAGNOSIS — F419 Anxiety disorder, unspecified: Secondary | ICD-10-CM | POA: Diagnosis not present

## 2017-12-21 DIAGNOSIS — F419 Anxiety disorder, unspecified: Secondary | ICD-10-CM | POA: Diagnosis not present

## 2018-01-04 ENCOUNTER — Other Ambulatory Visit: Payer: Self-pay | Admitting: Family Medicine

## 2018-01-04 DIAGNOSIS — F410 Panic disorder [episodic paroxysmal anxiety] without agoraphobia: Secondary | ICD-10-CM

## 2018-01-04 NOTE — Telephone Encounter (Signed)
Pt was called to advise of appt being needed. LVM KH

## 2018-01-04 NOTE — Telephone Encounter (Signed)
[facility]. Please advise KH 

## 2018-01-12 DIAGNOSIS — F419 Anxiety disorder, unspecified: Secondary | ICD-10-CM | POA: Diagnosis not present

## 2018-02-10 DIAGNOSIS — F419 Anxiety disorder, unspecified: Secondary | ICD-10-CM | POA: Diagnosis not present

## 2018-03-17 DIAGNOSIS — F419 Anxiety disorder, unspecified: Secondary | ICD-10-CM | POA: Diagnosis not present

## 2018-04-04 ENCOUNTER — Other Ambulatory Visit: Payer: Self-pay | Admitting: Family Medicine

## 2018-04-04 DIAGNOSIS — F410 Panic disorder [episodic paroxysmal anxiety] without agoraphobia: Secondary | ICD-10-CM

## 2018-04-04 NOTE — Telephone Encounter (Signed)
[facility] Please advise. kh

## 2018-04-07 ENCOUNTER — Telehealth: Payer: Self-pay

## 2018-04-07 ENCOUNTER — Other Ambulatory Visit: Payer: Self-pay | Admitting: Family Medicine

## 2018-04-07 DIAGNOSIS — F410 Panic disorder [episodic paroxysmal anxiety] without agoraphobia: Secondary | ICD-10-CM

## 2018-04-07 NOTE — Telephone Encounter (Signed)
LVM for pt to advise of med check appt needed. KH

## 2018-04-07 NOTE — Telephone Encounter (Signed)
LVM for pt to advise of med check appt need. KH

## 2018-04-07 NOTE — Telephone Encounter (Signed)
[facility] Please advise KH 

## 2018-04-07 NOTE — Telephone Encounter (Signed)
She needs an appointment.

## 2018-04-21 DIAGNOSIS — F419 Anxiety disorder, unspecified: Secondary | ICD-10-CM | POA: Diagnosis not present

## 2018-06-05 ENCOUNTER — Other Ambulatory Visit: Payer: Self-pay | Admitting: Family Medicine

## 2018-06-05 DIAGNOSIS — F410 Panic disorder [episodic paroxysmal anxiety] without agoraphobia: Secondary | ICD-10-CM

## 2018-06-05 NOTE — Telephone Encounter (Signed)
Needs med check appt

## 2018-06-05 NOTE — Telephone Encounter (Signed)
[facility] escitalopram. Please advise. KH 

## 2018-06-13 ENCOUNTER — Encounter: Payer: Self-pay | Admitting: Family Medicine

## 2018-06-13 ENCOUNTER — Ambulatory Visit: Payer: BLUE CROSS/BLUE SHIELD | Admitting: Family Medicine

## 2018-06-13 VITALS — BP 110/70 | HR 87 | Temp 97.7°F | Ht 65.75 in | Wt 143.6 lb

## 2018-06-13 DIAGNOSIS — Z0000 Encounter for general adult medical examination without abnormal findings: Secondary | ICD-10-CM

## 2018-06-13 DIAGNOSIS — F410 Panic disorder [episodic paroxysmal anxiety] without agoraphobia: Secondary | ICD-10-CM | POA: Diagnosis not present

## 2018-06-13 DIAGNOSIS — E739 Lactose intolerance, unspecified: Secondary | ICD-10-CM | POA: Diagnosis not present

## 2018-06-13 DIAGNOSIS — Z23 Encounter for immunization: Secondary | ICD-10-CM

## 2018-06-13 DIAGNOSIS — K582 Mixed irritable bowel syndrome: Secondary | ICD-10-CM | POA: Insufficient documentation

## 2018-06-13 DIAGNOSIS — K580 Irritable bowel syndrome with diarrhea: Secondary | ICD-10-CM

## 2018-06-13 DIAGNOSIS — F419 Anxiety disorder, unspecified: Secondary | ICD-10-CM | POA: Diagnosis not present

## 2018-06-13 LAB — POCT URINALYSIS DIP (PROADVANTAGE DEVICE)
Bilirubin, UA: NEGATIVE
Blood, UA: NEGATIVE
Glucose, UA: NEGATIVE mg/dL
Ketones, POC UA: NEGATIVE mg/dL
Leukocytes, UA: NEGATIVE
Nitrite, UA: NEGATIVE
Protein Ur, POC: NEGATIVE mg/dL
Specific Gravity, Urine: 1.02
Urobilinogen, Ur: 3.5
pH, UA: 6 (ref 5.0–8.0)

## 2018-06-13 MED ORDER — ESCITALOPRAM OXALATE 20 MG PO TABS: 20.0000 mg | ORAL_TABLET | Freq: Every day | ORAL | 1 refills | Status: DC

## 2018-06-13 NOTE — Progress Notes (Signed)
   Subjective:    Patient ID: Amy Velasquez, female    DOB: 06/05/1995, 23 y.o.   MRN: [id number]  HPI She is here for complete examination.  She does see a gynecologist in neurology and is scheduled in March.  She is seen to be doing well on birth control pills.  She also has a history of lactose intolerance as well as IBS-D.  She has seen a gynecologist concerning her IBS which she notes is usually stress related.  She continues to do well on her Lexapro.  Her life right now is going quite well and she is considering stopping the medication. Family and social history as well as health maintenance and immunizations was reviewed  Review of Systems  All other systems reviewed and are negative.      Objective:   Physical Exam Alert and in no distress. Tympanic membranes and canals are normal. Pharyngeal area is normal. Neck is supple without adenopathy or thyromegaly. Cardiac exam shows a regular sinus rhythm without murmurs or gallops. Lungs are clear to auscultation.        Assessment & Plan:  Routine general medical examination at a health care facility  Panic disorder - Plan: escitalopram (LEXAPRO) 20 MG tablet  Need for Tdap vaccination - Plan: Tdap vaccine greater than or equal to 7yo IM  Need for influenza vaccination - Plan: Flu Vaccine QUAD 6+ mos PF IM (Fluarix Quad PF)  Lactose intolerance in adult  Irritable bowel syndrome with diarrhea Encouraged her to continue to take good care of herself.  Discussed stopping the Lexapro by tapering.  Discussed possibly doing this in April.  She will let me know if she is still interested.

## 2018-07-15 DIAGNOSIS — F419 Anxiety disorder, unspecified: Secondary | ICD-10-CM | POA: Diagnosis not present

## 2018-07-17 ENCOUNTER — Ambulatory Visit: Payer: BLUE CROSS/BLUE SHIELD | Admitting: Family Medicine

## 2018-07-17 ENCOUNTER — Encounter: Payer: Self-pay | Admitting: Family Medicine

## 2018-07-17 ENCOUNTER — Other Ambulatory Visit: Payer: Self-pay

## 2018-07-17 VITALS — BP 110/70 | HR 68 | Temp 98.4°F | Wt 146.6 lb

## 2018-07-17 DIAGNOSIS — M542 Cervicalgia: Secondary | ICD-10-CM

## 2018-07-17 NOTE — Progress Notes (Signed)
   Subjective:    Patient ID: Amy Velasquez, female    DOB: [birth year], 23 y.o.   MRN: [id number]  HPI She states that 12 days ago she woke up with neck pain mainly on the left-hand side made worse with certain motions.  This slowly went away however 3 days ago she noted a reoccurrence of this.  The pain is in all directions but no associated numbness, tingling or weakness.  No overuse injury.  Yesterday while she was bending over she had acute onset of neck pain which caused a vasovagal response.  Today she states that the pain is down to a 5 from a high of 9.   Review of Systems     Objective:   Physical Exam Alert and in no distress.  Slight pain on motion in all directions as well as rotation.  Normal motor, sensory and DTRs.       Assessment & Plan:  Neck pain Recommend supportive care with heat, gentle stretching and Tylenol.  Also discussed the possible benefit of seeing a chiropractor for this.  She was comfortable with that.

## 2018-07-25 DIAGNOSIS — M25512 Pain in left shoulder: Secondary | ICD-10-CM | POA: Diagnosis not present

## 2018-07-25 DIAGNOSIS — M542 Cervicalgia: Secondary | ICD-10-CM | POA: Diagnosis not present

## 2018-07-25 DIAGNOSIS — M9901 Segmental and somatic dysfunction of cervical region: Secondary | ICD-10-CM | POA: Diagnosis not present

## 2018-07-25 DIAGNOSIS — M9907 Segmental and somatic dysfunction of upper extremity: Secondary | ICD-10-CM | POA: Diagnosis not present

## 2018-08-11 DIAGNOSIS — F419 Anxiety disorder, unspecified: Secondary | ICD-10-CM | POA: Diagnosis not present

## 2018-08-17 DIAGNOSIS — M542 Cervicalgia: Secondary | ICD-10-CM | POA: Diagnosis not present

## 2018-08-17 DIAGNOSIS — M9901 Segmental and somatic dysfunction of cervical region: Secondary | ICD-10-CM | POA: Diagnosis not present

## 2018-08-17 DIAGNOSIS — M9907 Segmental and somatic dysfunction of upper extremity: Secondary | ICD-10-CM | POA: Diagnosis not present

## 2018-08-17 DIAGNOSIS — M25512 Pain in left shoulder: Secondary | ICD-10-CM | POA: Diagnosis not present

## 2018-08-24 DIAGNOSIS — M25512 Pain in left shoulder: Secondary | ICD-10-CM | POA: Diagnosis not present

## 2018-08-24 DIAGNOSIS — M542 Cervicalgia: Secondary | ICD-10-CM | POA: Diagnosis not present

## 2018-08-24 DIAGNOSIS — M9907 Segmental and somatic dysfunction of upper extremity: Secondary | ICD-10-CM | POA: Diagnosis not present

## 2018-08-24 DIAGNOSIS — M9901 Segmental and somatic dysfunction of cervical region: Secondary | ICD-10-CM | POA: Diagnosis not present

## 2018-09-04 DIAGNOSIS — M545 Low back pain: Secondary | ICD-10-CM | POA: Diagnosis not present

## 2018-09-04 DIAGNOSIS — M542 Cervicalgia: Secondary | ICD-10-CM | POA: Diagnosis not present

## 2018-09-04 DIAGNOSIS — M9903 Segmental and somatic dysfunction of lumbar region: Secondary | ICD-10-CM | POA: Diagnosis not present

## 2018-09-04 DIAGNOSIS — M9901 Segmental and somatic dysfunction of cervical region: Secondary | ICD-10-CM | POA: Diagnosis not present

## 2018-09-04 DIAGNOSIS — M9907 Segmental and somatic dysfunction of upper extremity: Secondary | ICD-10-CM | POA: Diagnosis not present

## 2018-09-04 DIAGNOSIS — M25512 Pain in left shoulder: Secondary | ICD-10-CM | POA: Diagnosis not present

## 2018-09-15 DIAGNOSIS — F419 Anxiety disorder, unspecified: Secondary | ICD-10-CM | POA: Diagnosis not present

## 2018-09-29 DIAGNOSIS — F419 Anxiety disorder, unspecified: Secondary | ICD-10-CM | POA: Diagnosis not present

## 2018-10-19 DIAGNOSIS — F419 Anxiety disorder, unspecified: Secondary | ICD-10-CM | POA: Diagnosis not present

## 2018-11-04 DIAGNOSIS — Z20828 Contact with and (suspected) exposure to other viral communicable diseases: Secondary | ICD-10-CM | POA: Diagnosis not present

## 2018-11-09 DIAGNOSIS — F419 Anxiety disorder, unspecified: Secondary | ICD-10-CM | POA: Diagnosis not present

## 2018-11-26 DIAGNOSIS — Z3041 Encounter for surveillance of contraceptive pills: Secondary | ICD-10-CM | POA: Diagnosis not present

## 2018-11-30 DIAGNOSIS — F419 Anxiety disorder, unspecified: Secondary | ICD-10-CM | POA: Diagnosis not present

## 2018-12-28 DIAGNOSIS — F419 Anxiety disorder, unspecified: Secondary | ICD-10-CM | POA: Diagnosis not present

## 2018-12-30 ENCOUNTER — Other Ambulatory Visit: Payer: Self-pay | Admitting: Family Medicine

## 2018-12-30 DIAGNOSIS — F410 Panic disorder [episodic paroxysmal anxiety] without agoraphobia: Secondary | ICD-10-CM

## 2019-01-19 DIAGNOSIS — Z23 Encounter for immunization: Secondary | ICD-10-CM | POA: Diagnosis not present

## 2019-01-25 DIAGNOSIS — F419 Anxiety disorder, unspecified: Secondary | ICD-10-CM | POA: Diagnosis not present

## 2019-02-08 DIAGNOSIS — F419 Anxiety disorder, unspecified: Secondary | ICD-10-CM | POA: Diagnosis not present

## 2019-02-26 DIAGNOSIS — Z20828 Contact with and (suspected) exposure to other viral communicable diseases: Secondary | ICD-10-CM | POA: Diagnosis not present

## 2019-03-18 DIAGNOSIS — Z20828 Contact with and (suspected) exposure to other viral communicable diseases: Secondary | ICD-10-CM | POA: Diagnosis not present

## 2019-05-07 ENCOUNTER — Encounter: Payer: Self-pay | Admitting: Internal Medicine

## 2019-06-15 ENCOUNTER — Encounter: Payer: BLUE CROSS/BLUE SHIELD | Admitting: Family Medicine

## 2019-09-23 ENCOUNTER — Other Ambulatory Visit: Payer: Self-pay | Admitting: Family Medicine

## 2019-09-23 DIAGNOSIS — F410 Panic disorder [episodic paroxysmal anxiety] without agoraphobia: Secondary | ICD-10-CM

## 2019-09-25 NOTE — Telephone Encounter (Signed)
[facility] Please advise [facility]

## 2019-11-05 LAB — RESULTS CONSOLE HPV: CHL HPV: NEGATIVE

## 2021-04-04 DIAGNOSIS — N898 Other specified noninflammatory disorders of vagina: Secondary | ICD-10-CM | POA: Diagnosis not present

## 2021-04-04 DIAGNOSIS — Z708 Other sex counseling: Secondary | ICD-10-CM | POA: Diagnosis not present

## 2021-07-10 ENCOUNTER — Ambulatory Visit: Payer: BC Managed Care – PPO | Admitting: Physician Assistant

## 2021-07-10 ENCOUNTER — Encounter: Payer: Self-pay | Admitting: Physician Assistant

## 2021-07-10 ENCOUNTER — Other Ambulatory Visit: Payer: Self-pay

## 2021-07-10 VITALS — BP 120/70 | HR 84 | Ht 65.75 in | Wt 133.0 lb

## 2021-07-10 DIAGNOSIS — M25552 Pain in left hip: Secondary | ICD-10-CM | POA: Diagnosis not present

## 2021-07-10 DIAGNOSIS — M25551 Pain in right hip: Secondary | ICD-10-CM | POA: Diagnosis not present

## 2021-07-10 NOTE — Patient Instructions (Signed)
You can walk in to get x-rays: ? ?Diagnostic Radiology and Imaging ? ?[facility] ?[address] ?[address] ?[place], [place] [address] ? ?Phone [phone] ?Fax [phone] ? ?Hours of Operation ?General hours of operation are Monday - Friday, 8 am-5 pm  ? ?You will get a phone call to schedule an appointment with Orthopedics. ? ?You can take OTC pain medicine as needed: ? ?Tylenol (generic is acetamenophen) ? ?Advil or Motrin (generic is ibuprofen) ALWAYS TAKE WITH FOOD ?Aleve (generic is naprosyn sodium) ALWAYS TAKE WITH FOOD ? ?Aspercreme with lidocaine ?Muscle rubs like Biofreeze, IcyHot, Bengay ? ?Voltaren gel (generic is Diclofenac sodium)  ?

## 2021-07-10 NOTE — Progress Notes (Signed)
? ?Acute Office Visit ? ?Subjective:  ? ? Patient ID: Amy Velasquez, female    DOB: 1995-11-26, 26 y.o.   MRN: [id number] ? ?Chief Complaint  ?Patient presents with  ? Acute Visit  ?  Hip pain that's been going on for a few years that has gotten worse. The left side hurts worse than the other.   ? ? ?HPI ? ?Patient is in today for a follow up appointment. 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 ? ?Outpatient Medications Prior to Visit  ?Medication Sig Dispense Refill  ? escitalopram (LEXAPRO) 20 MG tablet TAKE 1 TABLET BY MOUTH EVERY DAY (Patient taking differently: 30 mg.) 90 tablet 2  ? ibuprofen (ADVIL,MOTRIN) 200 MG tablet Take 200 mg by mouth every 6 (six) hours as needed.    ? TRI-SPRINTEC 0.18/0.215/0.25 MG-35 MCG tablet TAKE 1 TABLET BY MOUTH EVERY DAY 84 tablet 0  ? acetaZOLAMIDE (DIAMOX) 125 MG tablet Take 1 tablet (125 mg total) by mouth 2 (two) times daily. (Patient not taking: Reported on 06/13/2018) 26 tablet 0  ? ciprofloxacin (CIPRO) 500 MG tablet Take 1 tablet (500 mg total) by mouth 2 (two) times daily. (Patient not taking: Reported on 06/13/2018) 10 tablet 0  ? ?No facility-administered medications prior to visit.  ? ? ?No Known Allergies ? ?Review of Systems  ?Constitutional:  Negative for activity change and chills.  ?HENT:  Negative for congestion and voice change.   ?Eyes:  Negative for pain and redness.  ?Respiratory:  Negative for cough and wheezing.   ?Cardiovascular:  Negative for chest pain.   ?Gastrointestinal:  Negative for constipation, diarrhea, nausea and vomiting.  ?Endocrine: Negative for polyuria.  ?Genitourinary:  Negative for frequency.  ?Musculoskeletal:  Positive for arthralgias. Negative for joint swelling.  ?Skin:  Negative for color change and rash.  ?Allergic/Immunologic: Negative for immunocompromised state.  ?Neurological:  Negative for dizziness.  ?Psychiatric/Behavioral:  Negative for agitation.   ? ?   ?Objective:  ?  ?Physical Exam ?Vitals and nursing note reviewed.  ?Constitutional:   ?   General: She is not in acute distress. ?   Appearance: Normal appearance. She is not ill-appearing.  ?HENT:  ?   Head: Normocephalic and atraumatic.  ?   Right Ear: External ear normal.  ?   Left Ear: External ear normal.  ?   Nose: No congestion.  ?Eyes:  ?   Extraocular Movements: Extraocular movements intact.  ?   Conjunctiva/sclera: Conjunctivae normal.  ?   Pupils: Pupils are equal, round, and reactive to light.  ?Cardiovascular:  ?   Rate and Rhythm: Normal rate and regular rhythm.  ?   Pulses: Normal pulses.  ?   Heart sounds: Normal heart sounds.  ?Pulmonary:  ?   Effort: Pulmonary effort is normal.  ?   Breath sounds: Normal breath sounds. No wheezing.  ?Abdominal:  ?   General: Bowel sounds are normal.  ?  Palpations: Abdomen is soft.  ?Musculoskeletal:  ?   Cervical back: Normal range of motion and neck supple.  ?   Right hip: Normal. No tenderness. Normal range of motion. Normal strength.  ?   Left hip: Tenderness present. Normal range of motion. Normal strength.  ?   Right lower leg: No edema.  ?   Left lower leg: No edema.  ?Skin: ?   General: Skin is warm and dry.  ?   Findings: No bruising.  ?Neurological:  ?   General: No focal deficit present.  ?   Mental Status: She is alert and oriented to person, place, and time.  ?Psychiatric:     ?   Mood and Affect: Mood normal.     ?   Behavior: Behavior normal.     ?   Thought Content: Thought content normal.  ? ? ?BP 120/70   Pulse 84    Wt 133 lb (60.3 kg)   LMP 06/04/2021 (Exact Date) Comment: Per pt  SpO2 99%   BMI 21.63 kg/m?  ? ?Wt Readings from Last 3 Encounters:  ?07/10/21 133 lb (60.3 kg)  ?07/17/18 146 lb 9.6 oz (66.5 kg)  ?06/13/18 143 lb 9.6 oz (65.1 kg)  ? ? ?Results for orders placed or performed in visit on 06/13/18  ?POCT Urinalysis DIP (Proadvantage Device)  ?Result Value Ref Range  ? Color, UA yellow yellow  ? Clarity, UA clear clear  ? Glucose, UA negative negative mg/dL  ? Bilirubin, UA negative negative  ? Ketones, POC UA negative negative mg/dL  ? Specific Gravity, Urine 1.020   ? Blood, UA negative negative  ? pH, UA 6.0 5.0 - 8.0  ? Protein Ur, POC negative negative mg/dL  ? Urobilinogen, Ur 3.5   ? Nitrite, UA Negative Negative  ? Leukocytes, UA Negative Negative  ? ? ?   ?Assessment & Plan:  ?1. Bilateral hip pain ?- DG HIPS BILAT W OR W/O PELVIS 2V; Future ?- stable, will order bilateral hip/pelvis xrays, referral to Ortho, You can take OTC pain medicine as needed:Tylenol (generic is acetamenophen), Advil or Motrin (generic is ibuprofen) ALWAYS TAKE WITH FOOD, Aleve (generic is naprosyn sodium) ALWAYS TAKE WITH FOOD, Aspercreme with lidocaine, Muscle rubs like Biofreeze, IcyHot, Bengay, Voltaren gel (generic is Diclofenac sodium) ? ? ? ? ?No orders of the defined types were placed in this encounter. ? ? ?Return in about 3 months (around 10/10/2021) for Return for Annual Exam. ? ?Jake Shark, PA-C ?

## 2021-07-13 ENCOUNTER — Other Ambulatory Visit: Payer: Self-pay | Admitting: Physician Assistant

## 2021-07-13 ENCOUNTER — Ambulatory Visit
Admission: RE | Admit: 2021-07-13 | Discharge: 2021-07-13 | Disposition: A | Discharge status: Home / Self Care | Payer: BC Managed Care – PPO | Source: Ambulatory Visit | Attending: Physician Assistant | Admitting: Physician Assistant

## 2021-07-13 ENCOUNTER — Other Ambulatory Visit: Payer: Self-pay

## 2021-07-13 DIAGNOSIS — M25551 Pain in right hip: Secondary | ICD-10-CM

## 2021-07-13 DIAGNOSIS — M25552 Pain in left hip: Secondary | ICD-10-CM | POA: Diagnosis not present

## 2021-07-21 ENCOUNTER — Ambulatory Visit: Payer: BC Managed Care – PPO | Admitting: Orthopaedic Surgery

## 2021-07-21 ENCOUNTER — Encounter: Payer: Self-pay | Admitting: Orthopaedic Surgery

## 2021-07-21 ENCOUNTER — Other Ambulatory Visit: Payer: Self-pay

## 2021-07-21 DIAGNOSIS — M25552 Pain in left hip: Secondary | ICD-10-CM | POA: Diagnosis not present

## 2021-07-21 NOTE — Progress Notes (Signed)
? ?Office Visit Note ?  ?Patient: Amy Velasquez           ?Date of Birth: July 06, 1995           ?MRN: [id number] ?Visit Date: 07/21/2021 ?             ?Requested by: Jake Shark, PA-C ?[address] ?[place],  [place] [address] ?PCP: Ronnald Nian, MD ? ? ?Assessment & Plan: ?Visit Diagnoses:  ?1. Pain in left hip   ? ? ?Plan: Impression is chronic left hip pain.  I think it is similar to piriformis syndrome.  I recommend a course of outpatient physical therapy with modalities as indicated.  Questions encouraged and answered.  Follow-up as needed. ? ?Follow-Up Instructions: No follow-ups on file.  ? ?Orders:  ?Orders Placed This Encounter  ?Procedures  ? Ambulatory referral to Physical Therapy  ? ?No orders of the defined types were placed in this encounter. ? ? ? ? Procedures: ?No procedures performed ? ? ?Clinical Data: ?No additional findings. ? ? ?Subjective: ?Chief Complaint  ?Patient presents with  ? Right Hip - Pain  ? Left Hip - Pain  ? ? ?HPI ? ?Amy Velasquez is a very pleasant 26 year old female here for evaluation of mainly left hip pain for about 5 years.  She states that this is more of a nuisance and is worse with mobilization and sometimes better with movement.  She sits for work most of the day.  She has some tightness in her back but this is not related to the leg pain.  Denies any groin pain or numbness and tingling.  Denies any injuries.  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 ? ?Review of Systems  ?Constitutional: Negative.   ?HENT: Negative.    ?Eyes: Negative.   ?Respiratory: Negative.    ?Cardiovascular: Negative.   ?Endocrine: Negative.   ?Musculoskeletal: Negative.   ?Neurological: Negative.   ?Hematological: Negative.   ?Psychiatric/Behavioral: Negative.    ?All other systems reviewed and are negative. ? ? ?Objective: ?Vital Signs: There were no vitals taken for this visit. ? ?Physical Exam ?Vitals and nursing note reviewed.  ?Constitutional:   ?   Appearance: She is  well-developed.  ?HENT:  ?   Head: Normocephalic and atraumatic.  ?Pulmonary:  ?   Effort: Pulmonary effort is normal.  ?Abdominal:  ?   Palpations: Abdomen is soft.  ?Musculoskeletal:  ?   Cervical back: Neck supple.  ?Skin: ?   General: Skin is warm.  ?   Capillary Refill: Capillary refill takes less than 2 seconds.  ?Neurological:  ?   Mental Status: She is alert and oriented to person, place, and time.  ?Psychiatric:     ?   Behavior: Behavior normal.     ?   Thought Content: Thought content normal.     ?   Judgment: Judgment normal.  ? ? ?Ortho Exam ? ?Examination left hip shows full range of motion.  No limping or antalgic gait.  She has a subtle pop over the posterior lateral aspect of the greater trochanter with FABER.  Negative Stinchfield.  Negative FADIR.  Greater trochanter slightly tender to palpation.  No sciatic tension signs.  SI joint is nontender.  Lumbar spine is nontender.  Proximal hamstrings attachment is not tender. ? ?Specialty Comments:  ?No specialty comments available. ? ?Imaging: ?No results found. ? ? ?PMFS History: ?Patient Active Problem List  ? Diagnosis Date Noted  ? Pain in left hip 07/21/2021  ? Irritable  bowel syndrome with diarrhea 06/13/2018  ? Lactose intolerance in adult 07/19/2016  ? Contraceptive management 07/19/2016  ? Panic disorder 10/27/2010  ? ?Past Medical History:  ?Diagnosis Date  ? Anxiety 02/2005  ? Eczema   ? mild, prior history of  ? Pneumonia   ? age 2 hospitalization  ? Sinusitis 03/1995  ?  ?Family History  ?Problem Relation Age of Onset  ? Hyperlipidemia Father   ? Cancer Paternal Grandmother   ?     colon  ? Cancer Maternal Grandfather   ?     lung  ? Heart disease Neg Hx   ? Stroke Neg Hx   ?  ?Past Surgical History:  ?Procedure Laterality Date  ? MOUTH SURGERY    ? bite realignment, phrenectomy, orthodontics  ? ?Social History  ? ?Occupational History  ? Not on file  ?Tobacco Use  ? Smoking status: Never  ? Smokeless tobacco: Never  ?Substance and Sexual  Activity  ? Alcohol use: Yes  ?  Comment: 1-2 drinks/week during school year  ? Drug use: No  ? Sexual activity: Never  ? ? ? ? ? ? ?

## 2021-07-23 ENCOUNTER — Other Ambulatory Visit: Payer: Self-pay | Admitting: Physician Assistant

## 2021-07-23 ENCOUNTER — Encounter: Payer: Self-pay | Admitting: Physician Assistant

## 2021-07-23 DIAGNOSIS — F5101 Primary insomnia: Secondary | ICD-10-CM

## 2021-07-23 MED ORDER — ZOLPIDEM TARTRATE 5 MG PO TABS: 5.0000 mg | ORAL_TABLET | Freq: Every evening | ORAL | 1 refills | Status: DC | PRN

## 2021-08-05 ENCOUNTER — Ambulatory Visit: Payer: BC Managed Care – PPO | Admitting: Physical Therapy

## 2021-09-09 DIAGNOSIS — D2371 Other benign neoplasm of skin of right lower limb, including hip: Secondary | ICD-10-CM | POA: Diagnosis not present

## 2021-09-09 DIAGNOSIS — B070 Plantar wart: Secondary | ICD-10-CM | POA: Diagnosis not present

## 2021-09-15 ENCOUNTER — Encounter: Payer: BC Managed Care – PPO | Admitting: Family Medicine

## 2021-09-24 DIAGNOSIS — D2371 Other benign neoplasm of skin of right lower limb, including hip: Secondary | ICD-10-CM | POA: Diagnosis not present

## 2021-10-12 DIAGNOSIS — B070 Plantar wart: Secondary | ICD-10-CM | POA: Diagnosis not present

## 2021-10-12 DIAGNOSIS — D2371 Other benign neoplasm of skin of right lower limb, including hip: Secondary | ICD-10-CM | POA: Diagnosis not present

## 2021-10-23 DIAGNOSIS — Z6821 Body mass index (BMI) 21.0-21.9, adult: Secondary | ICD-10-CM | POA: Diagnosis not present

## 2021-10-23 DIAGNOSIS — Z01419 Encounter for gynecological examination (general) (routine) without abnormal findings: Secondary | ICD-10-CM | POA: Diagnosis not present

## 2021-10-23 DIAGNOSIS — R87612 Low grade squamous intraepithelial lesion on cytologic smear of cervix (LGSIL): Secondary | ICD-10-CM | POA: Diagnosis not present

## 2021-10-23 DIAGNOSIS — Z124 Encounter for screening for malignant neoplasm of cervix: Secondary | ICD-10-CM | POA: Diagnosis not present

## 2021-12-16 DIAGNOSIS — R87619 Unspecified abnormal cytological findings in specimens from cervix uteri: Secondary | ICD-10-CM | POA: Diagnosis not present

## 2021-12-16 DIAGNOSIS — N870 Mild cervical dysplasia: Secondary | ICD-10-CM | POA: Diagnosis not present

## 2021-12-30 ENCOUNTER — Encounter: Payer: Self-pay | Admitting: Internal Medicine

## 2022-02-02 ENCOUNTER — Encounter: Payer: Self-pay | Admitting: Internal Medicine

## 2022-02-15 ENCOUNTER — Encounter: Payer: Self-pay | Admitting: Internal Medicine

## 2022-11-02 DIAGNOSIS — Z6824 Body mass index (BMI) 24.0-24.9, adult: Secondary | ICD-10-CM | POA: Diagnosis not present

## 2022-11-02 DIAGNOSIS — Z01419 Encounter for gynecological examination (general) (routine) without abnormal findings: Secondary | ICD-10-CM | POA: Diagnosis not present

## 2023-02-16 ENCOUNTER — Encounter: Payer: Self-pay | Admitting: Family Medicine

## 2023-02-16 ENCOUNTER — Ambulatory Visit: Payer: BC Managed Care – PPO | Admitting: Family Medicine

## 2023-02-16 VITALS — BP 118/78 | HR 89 | Ht 66.5 in | Wt 146.8 lb

## 2023-02-16 DIAGNOSIS — M25552 Pain in left hip: Secondary | ICD-10-CM

## 2023-02-16 DIAGNOSIS — F411 Generalized anxiety disorder: Secondary | ICD-10-CM | POA: Diagnosis not present

## 2023-02-16 DIAGNOSIS — F429 Obsessive-compulsive disorder, unspecified: Secondary | ICD-10-CM | POA: Diagnosis not present

## 2023-02-16 DIAGNOSIS — F410 Panic disorder [episodic paroxysmal anxiety] without agoraphobia: Secondary | ICD-10-CM

## 2023-02-16 DIAGNOSIS — F5101 Primary insomnia: Secondary | ICD-10-CM

## 2023-02-16 DIAGNOSIS — Z3009 Encounter for other general counseling and advice on contraception: Secondary | ICD-10-CM

## 2023-02-16 DIAGNOSIS — Z0000 Encounter for general adult medical examination without abnormal findings: Secondary | ICD-10-CM | POA: Diagnosis not present

## 2023-02-16 DIAGNOSIS — E739 Lactose intolerance, unspecified: Secondary | ICD-10-CM

## 2023-02-16 DIAGNOSIS — K580 Irritable bowel syndrome with diarrhea: Secondary | ICD-10-CM

## 2023-02-16 DIAGNOSIS — K582 Mixed irritable bowel syndrome: Secondary | ICD-10-CM

## 2023-02-16 NOTE — Progress Notes (Signed)
Complete physical exam  Patient: Amy Velasquez   DOB: 1995-10-02   27 y.o. Female  MRN: [id number]  Subjective:    Chief Complaint  Patient presents with   Annual Exam    Need EKG for psychiatrist.    Irritable Bowel Syndrome    Flare up. 8-10 days of constipation to 2 weeks of daily diarrhea. Smaller appetite.     Amy Velasquez is a 27 y.o. female who presents today for a complete physical exam. She reports consuming a general and low lactose  diet. Home exercise routine includes walking 2.5 hrs per week. Gym/ health club routine includes strength training and treadmill. She generally feels well. She reports sleeping well. She being treated right now for OCD as well as anxiety disorder.  She presently is taking Lexapro and her psychiatrist would like her to have an EKG as they are planning to possibly increase her medication.  She also has a history of IBS alternating constipation and diarrhea.  This is now starting to give her some difficulty and she would like further help with this.  Her work is going well.  She is also engaged to be married.  She does see a gynecologist and is presently on medication.  Otherwise she has no particular concerns or complaints.  Family and social history as well as health maintenance and immunizations was reviewed.  Most recent fall risk assessment:    02/16/2023    3:39 PM  Fall Risk   Falls in the past year? 0  Number falls in past yr: 0  Injury with Fall? 0     Most recent depression screenings:    02/16/2023    3:39 PM 07/10/2021    8:30 AM  PHQ 2/9 Scores  PHQ - 2 Score 0 0    Vision:Not within last year  and Dental: No current dental problems and Last dental visit: October 2023    Patient Care Team: Ronnald Nian, MD as PCP - General (Family Medicine)   Outpatient Medications Prior to Visit  Medication Sig   escitalopram (LEXAPRO) 20 MG tablet TAKE 1 TABLET BY MOUTH EVERY DAY (Patient taking differently: 30 mg.)    ibuprofen (ADVIL,MOTRIN) 200 MG tablet Take 200 mg by mouth every 6 (six) hours as needed.   loperamide (IMODIUM) 1 MG/5ML solution Take by mouth as needed for diarrhea or loose stools.   loperamide (IMODIUM) 2 MG capsule Take by mouth as needed for diarrhea or loose stools.   TRI-SPRINTEC 0.18/0.215/0.25 MG-35 MCG tablet TAKE 1 TABLET BY MOUTH EVERY DAY   zolpidem (AMBIEN) 5 MG tablet Take 1 tablet (5 mg total) by mouth at bedtime as needed for sleep. (Patient not taking: Reported on 02/16/2023)   No facility-administered medications prior to visit.    Review of Systems  All other systems reviewed and are negative.         Objective:     BP 118/78   Pulse 89   Ht 5' 6.5" (1.689 m)   Wt 146 lb 12.8 oz (66.6 kg)   LMP 01/30/2023 (Approximate)   SpO2 97%   BMI 23.34 kg/m    Physical Exam  Alert and in no distress. Tympanic membranes and canals are normal. Pharyngeal area is normal. Neck is supple without adenopathy or thyromegaly. Cardiac exam shows a regular sinus rhythm without murmurs or gallops. Lungs are clear to auscultation. Abdominal exam shows no masses or tenderness.   EKG read by me shows a regular sinus  rhythm with normal parameters. Assessment & Plan:    Routine general medical examination at a health care facility - Plan: CBC with Differential/Platelet, Comprehensive metabolic panel, Lipid panel  Lactose intolerance in adult - Plan: CBC with Differential/Platelet, Comprehensive metabolic panel  Irritable bowel syndrome with both constipation and diarrhea - Plan: Ambulatory referral to Gastroenterology  Generalized anxiety disorder - Plan: EKG 12-Lead  Obsessive-compulsive disorder, unspecified type  Immunization History  Administered Date(s) Administered   DTaP 03/01/1994, 04/30/1994, 07/01/1994, 07/06/1995, 12/01/1998   HIB (PRP-OMP) 03/01/1994, 04/30/1994, 07/01/1994, 07/06/1995   HPV Quadrivalent 01/20/2011, 03/22/2011, 07/21/2011   Hepatitis A,  Adult 11/30/2012, 01/08/2014   Hepatitis B 03/01/1994, 04/30/1994, 07/06/1995   IPV 03/01/1994, 04/30/1994, 07/01/1994, 07/06/1995   Influenza,inj,Quad PF,6+ Mos 12/24/2014, 06/13/2018   Influenza-Unspecified 01/28/2021, 01/21/2023   MMR 07/06/1995, 12/01/1998   Meningococcal polysaccharide vaccine (MPSV4) 11/30/2012   Moderna Sars-Covid-2 Vaccination 03/16/2020   PFIZER Comirnaty(Gray Top)Covid-19 Tri-Sucrose Vaccine 01/21/2023   Tdap 01/23/2007, 06/13/2018   Varicella 01/23/2007, 03/02/2007    Health Maintenance  Topic Date Due   HIV Screening  Never done   Hepatitis C Screening  Never done   Cervical Cancer Screening (Pap smear)  11/05/2022   COVID-19 Vaccine (5 - 2023-24 season) 03/18/2023   DTaP/Tdap/Td (8 - Td or Tdap) 06/13/2028   INFLUENZA VACCINE  Completed   HPV VACCINES  Completed   She will continue to be followed by her psychiatrist and therapist for her underlying OCD and I will also get GI involved to help with the IBS.  I think seeing a female gastroenterologist would be beneficial. I explained that I am here whenever she needs any help with any of this.   Sharlot Gowda, MD

## 2023-02-17 ENCOUNTER — Encounter: Payer: Self-pay | Admitting: Nurse Practitioner

## 2023-02-17 LAB — LIPID PANEL
Chol/HDL Ratio: 3.4 ratio (ref 0.0–4.4)
Cholesterol, Total: 203 mg/dL — ABNORMAL HIGH (ref 100–199)
HDL: 59 mg/dL (ref >39)
LDL Chol Calc (NIH): 130 mg/dL — ABNORMAL HIGH (ref 0–99)
Triglycerides: 77 mg/dL (ref 0–149)
VLDL Cholesterol Cal: 14 mg/dL (ref 5–40)

## 2023-02-17 LAB — COMPREHENSIVE METABOLIC PANEL
ALT: 34 IU/L — ABNORMAL HIGH (ref 0–32)
AST: 23 IU/L (ref 0–40)
Albumin: 4.4 g/dL (ref 4.0–5.0)
Alkaline Phosphatase: 57 IU/L (ref 44–121)
BUN/Creatinine Ratio: 9 (ref 9–23)
BUN: 8 mg/dL (ref 6–20)
Bilirubin Total: 0.3 mg/dL (ref 0.0–1.2)
CO2: 22 mmol/L (ref 20–29)
Calcium: 9.8 mg/dL (ref 8.7–10.2)
Chloride: 102 mmol/L (ref 96–106)
Creatinine, Ser: 0.94 mg/dL (ref 0.57–1.00)
Globulin, Total: 2.7 g/dL (ref 1.5–4.5)
Glucose: 81 mg/dL (ref 70–99)
Potassium: 4.4 mmol/L (ref 3.5–5.2)
Sodium: 139 mmol/L (ref 134–144)
Total Protein: 7.1 g/dL (ref 6.0–8.5)
eGFR: 84 mL/min/{1.73_m2} (ref >59)

## 2023-02-17 LAB — CBC WITH DIFFERENTIAL/PLATELET
Basophils Absolute: 0 10*3/uL (ref 0.0–0.2)
Basos: 1 %
EOS (ABSOLUTE): 0.1 10*3/uL (ref 0.0–0.4)
Eos: 1 %
Hematocrit: 40.4 % (ref 34.0–46.6)
Hemoglobin: 12.7 g/dL (ref 11.1–15.9)
Immature Grans (Abs): 0 10*3/uL (ref 0.0–0.1)
Immature Granulocytes: 0 %
Lymphocytes Absolute: 2.2 10*3/uL (ref 0.7–3.1)
Lymphs: 28 %
MCH: 27 pg (ref 26.6–33.0)
MCHC: 31.4 g/dL — ABNORMAL LOW (ref 31.5–35.7)
MCV: 86 fL (ref 79–97)
Monocytes Absolute: 0.4 10*3/uL (ref 0.1–0.9)
Monocytes: 5 %
Neutrophils Absolute: 5.1 10*3/uL (ref 1.4–7.0)
Neutrophils: 65 %
Platelets: 296 10*3/uL (ref 150–450)
RBC: 4.7 x10E6/uL (ref 3.77–5.28)
RDW: 12.9 % (ref 11.7–15.4)
WBC: 7.7 10*3/uL (ref 3.4–10.8)

## 2023-03-05 ENCOUNTER — Encounter: Payer: Self-pay | Admitting: Family Medicine

## 2023-03-05 IMAGING — CR DG HIP (WITH OR WITHOUT PELVIS) 3-4V BILAT
3 series · 3 of 3 positions shown · non-contrast
Comparison: None.

CLINICAL DATA: bilateral hip pain

EXAM:
DG HIP (WITH OR WITHOUT PELVIS) 3-4V BILAT

[w pelvis upright]
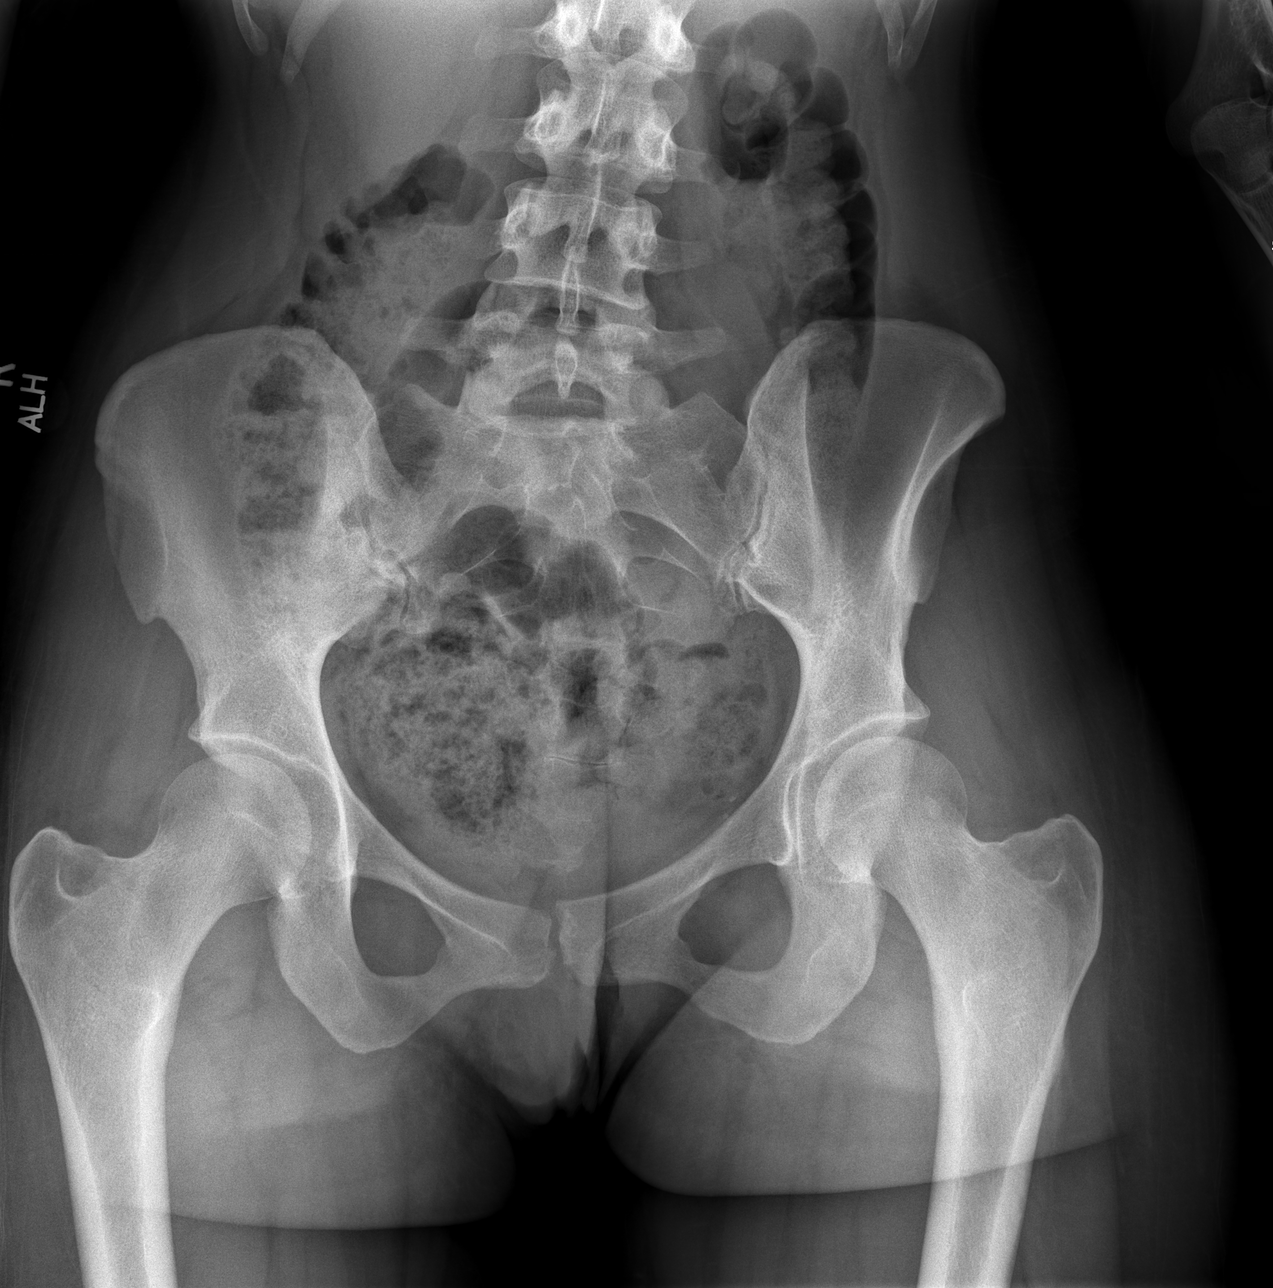

[w hip frog left]
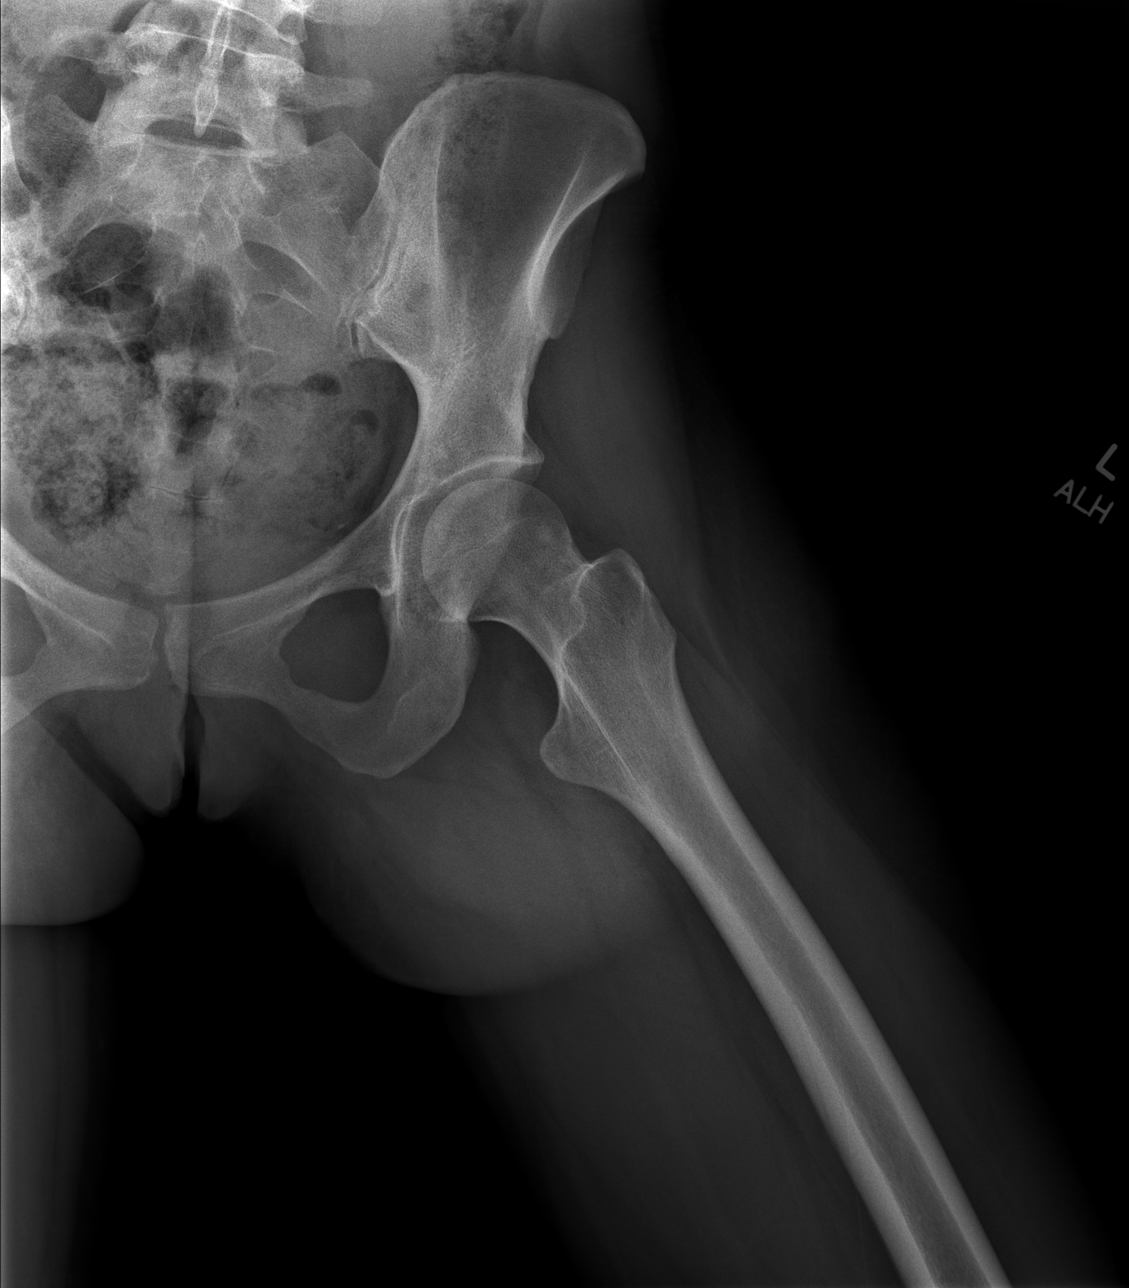

[w hip frog right]
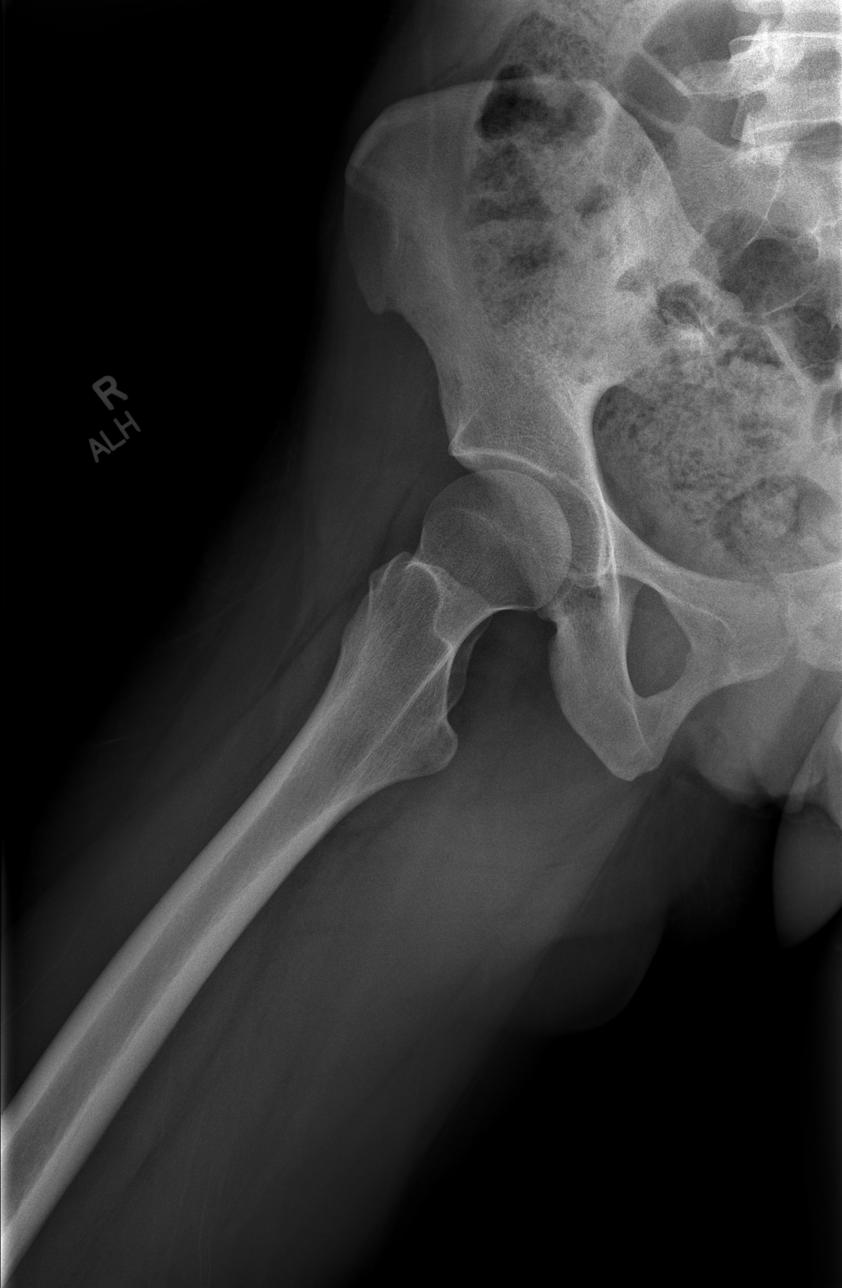

[3 of 3 positions shown; findings below may reference images not displayed]

FINDINGS: There is no evidence of hip fracture or dislocation of either hips.
No acute displaced fracture or diastasis of the bones of the pelvis.
There is no evidence of arthropathy or other focal bone abnormality.
IMPRESSION: Negative.

## 2023-04-02 ENCOUNTER — Ambulatory Visit
Admission: RE | Admit: 2023-04-02 | Discharge: 2023-04-02 | Disposition: A | Discharge status: Home / Self Care | Payer: BC Managed Care – PPO | Source: Ambulatory Visit | Attending: Family Medicine | Admitting: Family Medicine

## 2023-04-02 VITALS — BP 108/75 | HR 82 | Temp 98.4°F | Resp 16

## 2023-04-02 DIAGNOSIS — K582 Mixed irritable bowel syndrome: Secondary | ICD-10-CM | POA: Diagnosis not present

## 2023-04-02 DIAGNOSIS — R112 Nausea with vomiting, unspecified: Secondary | ICD-10-CM | POA: Diagnosis not present

## 2023-04-02 DIAGNOSIS — R197 Diarrhea, unspecified: Secondary | ICD-10-CM

## 2023-04-02 LAB — POCT URINALYSIS DIP (MANUAL ENTRY)
Bilirubin, UA: NEGATIVE
Glucose, UA: NEGATIVE mg/dL
Leukocytes, UA: NEGATIVE
Nitrite, UA: NEGATIVE
Protein Ur, POC: NEGATIVE mg/dL
Spec Grav, UA: 1.02 (ref 1.010–1.025)
Urobilinogen, UA: 1 U/dL
pH, UA: 7 (ref 5.0–8.0)

## 2023-04-02 LAB — POCT URINE PREGNANCY: Preg Test, Ur: NEGATIVE

## 2023-04-02 MED ORDER — ONDANSETRON 8 MG PO TBDP: 8.0000 mg | ORAL_TABLET | Freq: Three times a day (TID) | ORAL | 0 refills | Status: DC | PRN

## 2023-04-02 NOTE — ED Provider Notes (Signed)
[facility]  Note:  This document was prepared using Conservation officer, historic buildings and may include unintentional dictation errors.  MRN: [id number] DOB: 12-05-1995  Subjective:   Amy Velasquez is a 27 y.o. female presenting for 3 day history of acute onset severe nausea, vomiting, diarrhea. Has felt like she is vomiting bile, has mucus in her stools. Has a history of IBS, had a flare up in October to mid November. Has an appointment for follow up with her GI doctor. Has used Pepto. Did a pregnancy test at home yesterday, was negative. Would like a recheck with [place].   No current facility-administered medications for this encounter.  Current Outpatient Medications:    bismuth subsalicylate (PEPTO BISMOL) 262 MG chewable tablet, Chew 524 mg by mouth as needed., Disp: , Rfl:    clonazePAM (KLONOPIN) 0.5 MG tablet, Take by mouth., Disp: , Rfl:    escitalopram (LEXAPRO) 20 MG tablet, TAKE 1 TABLET BY MOUTH EVERY DAY (Patient taking differently: 30 mg.), Disp: 90 tablet, Rfl: 2   ibuprofen (ADVIL,MOTRIN) 200 MG tablet, Take 200 mg by mouth every 6 (six) hours as needed., Disp: , Rfl:    loperamide (IMODIUM) 1 MG/5ML solution, Take by mouth as needed for diarrhea or loose stools., Disp: , Rfl:    loperamide (IMODIUM) 2 MG capsule, Take by mouth as needed for diarrhea or loose stools., Disp: , Rfl:    TRI-SPRINTEC 0.18/0.215/0.25 MG-35 MCG tablet, TAKE 1 TABLET BY MOUTH EVERY DAY, Disp: 84 tablet, Rfl: 0   zolpidem (AMBIEN) 5 MG tablet, Take 1 tablet (5 mg total) by mouth at bedtime as needed for sleep. (Patient not taking: Reported on 02/16/2023), Disp: 30 tablet, Rfl: 1   Allergies  Allergen Reactions   Nickel Rash    Past Medical History:  Diagnosis Date   Anxiety 02/2005   Eczema    mild, prior history of   Pneumonia    age 11 hospitalization   Sinusitis 03/1995     Past Surgical History:  Procedure Laterality Date   MOUTH SURGERY     bite  realignment, phrenectomy, orthodontics    Family History  Problem Relation Age of Onset   Hyperlipidemia Father    Cancer Paternal Grandmother        colon   Cancer Maternal Grandfather        lung   Heart disease Neg Hx    Stroke Neg Hx     Social History   Tobacco Use   Smoking status: Never   Smokeless tobacco: Never  Substance Use Topics   Alcohol use: Yes    Comment: 1-2 drinks/week during school year   Drug use: No    ROS   Objective:   Vitals: BP 108/75 (BP Location: Left Arm)   Pulse 82   Temp 98.4 F (36.9 C) (Oral)   Resp 16   LMP 03/23/2023 (Exact Date)   SpO2 96%   Physical Exam Constitutional:      General: She is not in acute distress.    Appearance: Normal appearance. She is well-developed. She is not ill-appearing, toxic-appearing or diaphoretic.  HENT:     Head: Normocephalic and atraumatic.     Nose: Nose normal.     Mouth/Throat:     Mouth: Mucous membranes are moist.  Eyes:     General: No scleral icterus.       Right eye: No discharge.        Left eye: No discharge.  Extraocular Movements: Extraocular movements intact.     Conjunctiva/sclera: Conjunctivae normal.  Cardiovascular:     Rate and Rhythm: Normal rate.  Pulmonary:     Effort: Pulmonary effort is normal.  Abdominal:     General: Bowel sounds are normal. There is no distension.     Palpations: Abdomen is soft. There is no mass.     Tenderness: There is no abdominal tenderness. There is no right CVA tenderness, left CVA tenderness, guarding or rebound.  Skin:    General: Skin is warm and dry.  Neurological:     General: No focal deficit present.     Mental Status: She is alert and oriented to person, place, and time.  Psychiatric:        Mood and Affect: Mood normal.        Behavior: Behavior normal.        Thought Content: Thought content normal.        Judgment: Judgment normal.    Results for orders placed or performed during the hospital encounter of  04/02/23 (from the past 24 hour(s))  POCT urinalysis dipstick     Status: Abnormal   Collection Time: 04/02/23  1:52 PM  Result Value Ref Range   Color, UA yellow yellow   Clarity, UA clear clear   Glucose, UA negative negative mg/dL   Bilirubin, UA negative negative   Ketones, POC UA small (15) (A) negative mg/dL   Spec Grav, UA 1.610 9.604 - 1.025   Blood, UA trace-intact (A) negative   pH, UA 7.0 5.0 - 8.0   Protein Ur, POC negative negative mg/dL   Urobilinogen, UA 1.0 0.2 or 1.0 E.U./dL   Nitrite, UA Negative Negative   Leukocytes, UA Negative Negative  POCT urine pregnancy     Status: None   Collection Time: 04/02/23  1:52 PM  Result Value Ref Range   Preg Test, Ur Negative Negative    Assessment and Plan :   PDMP not reviewed this encounter.  1. Nausea and vomiting, unspecified vomiting type   2. Diarrhea, unspecified type   3. Irritable bowel syndrome with both constipation and diarrhea    Patient declined GI panel through our clinic.  Recommended general supportive care, fluids, light meals, Zofran and Imodium as needed.  Counseled patient on potential for adverse effects with medications prescribed/recommended today, ER and return-to-clinic precautions discussed, patient verbalized understanding.  Maintain follow-up appointment coming up with her gastroenterologist.    Wallis Bamberg, PA-C 04/02/23 1357

## 2023-04-02 NOTE — Discharge Instructions (Addendum)

## 2023-04-02 NOTE — ED Triage Notes (Signed)
Pt reports I', severe nausea, vomiting stomach acid, low appetite, diarrhea, mucus in stools x 3 days. Reports she has IBS, she had a flared up x 6 weeks, ended the second week of November. Pt will be seen by Gastroenterologist next week.Bonney Roussel Bismol gives some relief.   Reports negative pregnancy test yesterday.

## 2023-04-07 ENCOUNTER — Ambulatory Visit: Payer: BC Managed Care – PPO | Admitting: Physician Assistant

## 2023-04-07 ENCOUNTER — Other Ambulatory Visit (INDEPENDENT_AMBULATORY_CARE_PROVIDER_SITE_OTHER): Payer: BC Managed Care – PPO

## 2023-04-07 ENCOUNTER — Other Ambulatory Visit: Payer: BC Managed Care – PPO

## 2023-04-07 ENCOUNTER — Encounter: Payer: Self-pay | Admitting: Physician Assistant

## 2023-04-07 VITALS — BP 98/70 | HR 72 | Wt 140.0 lb

## 2023-04-07 DIAGNOSIS — R112 Nausea with vomiting, unspecified: Secondary | ICD-10-CM | POA: Diagnosis not present

## 2023-04-07 DIAGNOSIS — R109 Unspecified abdominal pain: Secondary | ICD-10-CM | POA: Diagnosis not present

## 2023-04-07 DIAGNOSIS — R634 Abnormal weight loss: Secondary | ICD-10-CM

## 2023-04-07 DIAGNOSIS — R197 Diarrhea, unspecified: Secondary | ICD-10-CM

## 2023-04-07 DIAGNOSIS — R110 Nausea: Secondary | ICD-10-CM | POA: Diagnosis not present

## 2023-04-07 DIAGNOSIS — R195 Other fecal abnormalities: Secondary | ICD-10-CM

## 2023-04-07 DIAGNOSIS — R194 Change in bowel habit: Secondary | ICD-10-CM

## 2023-04-07 LAB — SEDIMENTATION RATE: Sed Rate: 6 mm/h (ref 0–20)

## 2023-04-07 LAB — HIGH SENSITIVITY CRP: CRP, High Sensitivity: 2.64 mg/L (ref 0.000–5.000)

## 2023-04-07 MED ORDER — DICYCLOMINE HCL 10 MG PO CAPS: 10.0000 mg | ORAL_CAPSULE | Freq: Three times a day (TID) | ORAL | 2 refills | Status: DC | PRN

## 2023-04-07 MED ORDER — NA SULFATE-K SULFATE-MG SULF 17.5-3.13-1.6 GM/177ML PO SOLN: 1.0000 | Freq: Once | ORAL | 0 refills | Status: AC

## 2023-04-07 NOTE — Patient Instructions (Signed)
Your provider has requested that you go to the basement level for lab work before leaving today. Press "B" on the elevator. The lab is located at the first door on the left as you exit the elevator.  You have been scheduled for a colonoscopy. Please follow written instructions given to you at your visit today.   Please pick up your prep supplies at the pharmacy within the next 1-3 days.  If you use inhalers (even only as needed), please bring them with you on the day of your procedure.  DO NOT TAKE 7 DAYS PRIOR TO TEST- Trulicity (dulaglutide) Ozempic, Wegovy (semaglutide) Mounjaro (tirzepatide) Bydureon Bcise (exanatide extended release)  DO NOT TAKE 1 DAY PRIOR TO YOUR TEST Rybelsus (semaglutide) Adlyxin (lixisenatide) Victoza (liraglutide) Byetta (exanatide) ___________________________________________________________________________  Due to recent changes in healthcare laws, you may see the results of your imaging and laboratory studies on MyChart before your provider has had a chance to review them.  We understand that in some cases there may be results that are confusing or concerning to you. Not all laboratory results come back in the same time frame and the provider may be waiting for multiple results in order to interpret others.  Please give [place] 48 hours in order for your provider to thoroughly review all the results before contacting the office for clarification of your results.    We have sent the following medications to your pharmacy for you to pick up at your convenience: Bentyl Suprep  I appreciate the  opportunity to care for you  Thank You   Amy Myrtie Hawk

## 2023-04-07 NOTE — Progress Notes (Signed)
Agree with assessment and plan as outlined.  

## 2023-04-07 NOTE — Progress Notes (Signed)
Subjective:    Patient ID: Amy Velasquez, female    DOB: 12-12-1995, 27 y.o.   MRN: [id number]  HPI Amy Velasquez is a pleasant 27 year old white female, new to GI today referred by Dr. Susann Givens for evaluation of change in bowel habits, intermittent diarrhea, abdominal cramping and pain. She has not had any prior GI evaluation.  She is generally in good health, does have history of lactose intolerance and usually avoids lactose or takes Lactaid.  Also with history of generalized anxiety disorder and OCD. She says that she has had symptoms off and on over the past couple of years but seems to have "flares" with periods of time with significantly worse symptoms.  She started with a "flare" in early October 2024 which is persisting and is the longest episode she has had.  She says she is at that point she had been having some constipation alternating with diarrhea with up to 8 bowel movements per day.  She says her symptoms were always worse in the morning hours, sometimes associated with nausea and vomiting.  She also would get tenesmus symptoms with a continued urge for bowel movements even without any passage of stool. She did go to urgent care this past weekend because she was also having nausea and vomiting.  She had had a pregnancy test done which was negative was given a prescription for Zofran. She says overall since onset of this "flare" she is lost 10 to 12 pounds, knows that she is eating less due to her symptoms.  She says she feels fatigued.  She has been using Pepto-Bismol fairly regularly and seems to find that is helpful as Imodium.  She is not seeing any blood in her stools but has seen a lot of mucus.  Prior to onset of this current exacerbation she had not been on any new medications or antibiotics.  She did go to the [place] later in October but says that her symptoms were already present at that time. She mentions that her sister age 29 has recently been diagnosed with  microscopic colitis and is concerned that she may have a similar problem. Family history is pertinent for her grandfather in his 9s with colon cancer in father with polyps. She does feel that in the past some of these flares have been exacerbated by stress or anxiety and she has had a lot going on over the past months but is not sure whether to attribute this to stress or not.  No prior abdominal imaging.  Review of Systems Pertinent positive and negative review of systems were noted in the above HPI section.  All other review of systems was otherwise negative.   Outpatient Encounter Medications as of 04/07/2023  Medication Sig   bismuth subsalicylate (PEPTO BISMOL) 262 MG chewable tablet Chew 524 mg by mouth as needed.   clonazePAM (KLONOPIN) 0.5 MG tablet Take by mouth.   dicyclomine (BENTYL) 10 MG capsule Take 1 capsule (10 mg total) by mouth 3 (three) times daily as needed for spasms.   escitalopram (LEXAPRO) 20 MG tablet TAKE 1 TABLET BY MOUTH EVERY DAY (Patient taking differently: 30 mg.)   ibuprofen (ADVIL,MOTRIN) 200 MG tablet Take 200 mg by mouth every 6 (six) hours as needed.   loperamide (IMODIUM) 1 MG/5ML solution Take by mouth as needed for diarrhea or loose stools.   loperamide (IMODIUM) 2 MG capsule Take by mouth as needed for diarrhea or loose stools.   Na Sulfate-K Sulfate-Mg Sulf 17.5-3.13-1.6 GM/177ML SOLN Take  1 kit by mouth once for 1 dose.   ondansetron (ZOFRAN-ODT) 8 MG disintegrating tablet Take 1 tablet (8 mg total) by mouth every 8 (eight) hours as needed for nausea or vomiting.   TRI-SPRINTEC 0.18/0.215/0.25 MG-35 MCG tablet TAKE 1 TABLET BY MOUTH EVERY DAY   zolpidem (AMBIEN) 5 MG tablet Take 1 tablet (5 mg total) by mouth at bedtime as needed for sleep. (Patient not taking: Reported on 02/16/2023)   No facility-administered encounter medications on file as of 04/07/2023.   Allergies  Allergen Reactions   Nickel Rash   Patient Active Problem List   Diagnosis  Date Noted   Generalized anxiety disorder 02/16/2023   Obsessive-compulsive disorder 02/16/2023   Primary insomnia 07/23/2021   Pain in left hip 07/21/2021   Irritable bowel syndrome with both constipation and diarrhea 06/13/2018   Lactose intolerance in adult 07/19/2016   Contraceptive management 07/19/2016   Panic disorder 10/27/2010   Social History   Socioeconomic History   Marital status: Single    Spouse name: Not on file   Number of children: Not on file   Years of education: Not on file   Highest education level: Not on file  Occupational History   Not on file  Tobacco Use   Smoking status: Never   Smokeless tobacco: Never  Substance and Sexual Activity   Alcohol use: Yes    Comment: 1-2 drinks/week during school year   Drug use: No   Sexual activity: Never  Other Topics Concern   Not on file  Social History Narrative   Exercising - Yoga, nature hikes      Social Drivers of Health   Financial Resource Strain: Low Risk  (02/16/2023)   Overall Financial Resource Strain (CARDIA)    Difficulty of Paying Living Expenses: Not very hard  Food Insecurity: No Food Insecurity (02/16/2023)   Hunger Vital Sign    Worried About Running Out of Food in the Last Year: Never true    Ran Out of Food in the Last Year: Never true  Transportation Needs: No Transportation Needs (02/16/2023)   PRAPARE - Administrator, Civil Service (Medical): No    Lack of Transportation (Non-Medical): No  Physical Activity: Sufficiently Active (02/16/2023)   Exercise Vital Sign    Days of Exercise per Week: 5 days    Minutes of Exercise per Session: 30 min  Stress: Stress Concern Present (02/16/2023)   [employer] of Occupational Health - Occupational Stress Questionnaire    Feeling of Stress : To some extent  Social Connections: Socially Integrated (02/16/2023)   Social Connection and Isolation Panel [NHANES]    Frequency of Communication with Friends and Family: More than  three times a week    Frequency of Social Gatherings with Friends and Family: More than three times a week    Attends Religious Services: 1 to 4 times per year    Active Member of [employer] or Organizations: Yes    Attends Banker Meetings: 1 to 4 times per year    Marital Status: Living with partner  Intimate Partner Violence: Not At Risk (02/16/2023)   Humiliation, Afraid, Rape, and Kick questionnaire    Fear of Current or Ex-Partner: No    Emotionally Abused: No    Physically Abused: No    Sexually Abused: No    Ms. Meaney family history includes Cancer in her maternal grandfather and paternal grandmother; Hyperlipidemia in her father.      Objective:  Vitals:   04/07/23 1009  BP: 98/70  Pulse: 72    Physical Exam Well-developed well-nourished young white female in no acute distress.  Pleasant height, Weight, 140 BMI 22.2  HEENT; nontraumatic normocephalic, EOMI, PE R LA, sclera anicteric. Oropharynx; not examined today Neck; supple, no JVD Cardiovascular; regular rate and rhythm with S1-S2, no murmur rub or gallop Pulmonary; Clear bilaterally Abdomen; soft,  nondistended, does have some tenderness in the right lower quadrant, no palpable mass or hepatosplenomegaly, bowel sounds are active Rectal; not done today Skin; benign exam, no jaundice rash or appreciable lesions Extremities; no clubbing cyanosis or edema skin warm and dry Neuro/Psych; alert and oriented x4, grossly nonfocal mood and affect appropriate        Assessment & Plan:   #79 27 year old white female with couple of year history of intermittent episodes of alternating bowel habits with constipation followed by days of diarrhea with up to 8 bowel movements per day.  These episodes are usually associated with abdominal cramping and pain, sometimes nausea and vomiting. She has had a prolonged episode currently which started in early October and is the longest episode she has had.  This has  been associated with a 10 to 12 pound weight loss, more persistent and frequent diarrhea/loose stools, abdominal cramping and discomfort, mucoid stools but no heme.  Etiology of symptoms is not clear, concerned about underlying IBD, doubt current episode infectious but will rule out. Patient sister age 39 with microscopic colitis.  #2 lactose intolerance #3.  Generalized anxiety disorder and OCD  Plan; we will give her a trial of glycopyrrolate 2 mg p.o. twice daily, advise she could start with 1 every morning prior to breakfast, then use a second dose as needed later in the day. Will check sed rate, CRP, TTG and IgA, fecal calprotectin and GI path panel Have also gone ahead and scheduled for colonoscopy with Dr. Adela Lank, will need random biopsies if colonoscopy normal.  Procedure was discussed in detail with the patient including indications risk and benefits and she is agreeable to proceed. If above labs or stool tests latus in another direction, colonoscopy can be canceled.  Sammuel Cooper PA-C 04/07/2023   Cc: Ronnald Nian, MD

## 2023-04-08 LAB — IGA: Immunoglobulin A: 144 mg/dL (ref 47–310)

## 2023-04-08 LAB — TISSUE TRANSGLUTAMINASE, IGA: (tTG) Ab, IgA: <1 U/mL

## 2023-04-09 LAB — GI PROFILE, STOOL, PCR

## 2023-04-09 LAB — CALPROTECTIN, FECAL: Calprotectin, Fecal: 142 ug/g — ABNORMAL HIGH (ref 0–120)

## 2023-04-21 ENCOUNTER — Encounter: Payer: Self-pay | Admitting: *Deleted

## 2023-04-21 ENCOUNTER — Telehealth: Payer: Self-pay | Admitting: Physician Assistant

## 2023-04-21 MED ORDER — NA SULFATE-K SULFATE-MG SULF 17.5-3.13-1.6 GM/177ML PO SOLN: 1.0000 | Freq: Once | ORAL | 0 refills | Status: AC

## 2023-04-21 NOTE — Telephone Encounter (Signed)
Suprep sent to pharmacy and new colonoscopy instructions printed and mailed

## 2023-04-21 NOTE — Telephone Encounter (Signed)
Inbound call from patient, scheduled procedure for 1/6 at 11:00 AM, with Dr. Adela Lank, patient would like updated instructions sent to her.

## 2023-04-28 ENCOUNTER — Encounter: Payer: Self-pay | Admitting: Gastroenterology

## 2023-04-28 ENCOUNTER — Encounter: Payer: Self-pay | Admitting: Certified Registered Nurse Anesthetist

## 2023-04-28 ENCOUNTER — Telehealth: Payer: Self-pay | Admitting: Physician Assistant

## 2023-04-28 ENCOUNTER — Other Ambulatory Visit: Payer: Self-pay

## 2023-04-28 DIAGNOSIS — R109 Unspecified abdominal pain: Secondary | ICD-10-CM

## 2023-04-28 DIAGNOSIS — R110 Nausea: Secondary | ICD-10-CM

## 2023-04-28 DIAGNOSIS — R634 Abnormal weight loss: Secondary | ICD-10-CM

## 2023-04-28 NOTE — Telephone Encounter (Signed)
 Unfortunately I cannot do that. Her family history would not warrant her to have a colonoscopy at this time, she actually is not due until age 28 for screening purposes. I have to be honest with the main reason for the test which is her symptoms, that is why we are doing the exam. Please tell her I am sorry but I don't see how we could code it any other way.

## 2023-04-28 NOTE — Telephone Encounter (Signed)
 Pt is wanting to know if her colonoscopy could be coded as surveillance or high risk since she has history of colon cancer within her family. This would help with the cost of her procedure by her insurance.

## 2023-04-28 NOTE — Telephone Encounter (Signed)
 PT is calling discuss the coding of her procedure. She feels it should be surveillance due to her family history. The colonoscopy is 1/6.  Please advise.

## 2023-04-29 ENCOUNTER — Telehealth: Payer: Self-pay | Admitting: Physician Assistant

## 2023-04-29 NOTE — Telephone Encounter (Signed)
 Inbound call from patient, states she would like to know if Dr. Paulo Fruit recommends for her to have and EGD along with her colonoscopy. Patient states she would like to continue with colonoscopy only if he agrees it to be necessary.

## 2023-05-02 ENCOUNTER — Encounter: Payer: Self-pay | Admitting: Gastroenterology

## 2023-05-02 ENCOUNTER — Ambulatory Visit (AMBULATORY_SURGERY_CENTER): Payer: BC Managed Care – PPO | Admitting: Gastroenterology

## 2023-05-02 VITALS — BP 104/60 | HR 66 | Temp 98.6°F | Resp 14 | Ht 67.0 in | Wt 130.0 lb

## 2023-05-02 DIAGNOSIS — R109 Unspecified abdominal pain: Secondary | ICD-10-CM

## 2023-05-02 DIAGNOSIS — R110 Nausea: Secondary | ICD-10-CM

## 2023-05-02 DIAGNOSIS — K5140 Inflammatory polyps of colon without complications: Secondary | ICD-10-CM

## 2023-05-02 DIAGNOSIS — R197 Diarrhea, unspecified: Secondary | ICD-10-CM

## 2023-05-02 DIAGNOSIS — R195 Other fecal abnormalities: Secondary | ICD-10-CM | POA: Diagnosis not present

## 2023-05-02 DIAGNOSIS — D127 Benign neoplasm of rectosigmoid junction: Secondary | ICD-10-CM

## 2023-05-02 MED ORDER — SODIUM CHLORIDE 0.9 % IV SOLN: 500.0000 mL | Freq: Once | INTRAVENOUS | Status: DC

## 2023-05-02 MED ORDER — OMEPRAZOLE 20 MG PO CPDR: 20.0000 mg | DELAYED_RELEASE_CAPSULE | Freq: Every day | ORAL | 3 refills | Status: DC

## 2023-05-02 NOTE — Progress Notes (Signed)
 Pt reports weight loss, 130lbs since last office visit on 04/29/2023.

## 2023-05-02 NOTE — Op Note (Signed)
 [facility] Patient Name: Amy Velasquez Procedure Date: 05/02/2023 10:31 AM MRN: [id number] Endoscopist: Elspeth P. 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 Also                            with abdominal cramping, nausea. Zofran  and Bentyl                             have helped but symptoms persist. Infectious workup                            negative. Fecal calprotectin 142. Sister has                            microscopic colitis. Medicines:                Monitored Anesthesia Care Procedure:                Pre-Anesthesia Assessment:                           - Prior to the procedure, a History and Physical                            was performed, and patient medications and                            allergies were reviewed. The patient's tolerance of                            previous anesthesia was also reviewed. The risks                            and benefits of the procedure and the sedation                            options and risks were discussed with the patient.                            All questions were answered, and informed consent                            was obtained. Prior Anticoagulants: The patient has                            taken no anticoagulant or antiplatelet agents. ASA  Grade Assessment: II - A patient with mild systemic                            disease. After reviewing the risks and benefits,                            the patient was deemed in satisfactory condition to                            undergo the procedure.                           After obtaining informed consent, the colonoscope                            was passed under direct  vision. Throughout the                            procedure, the patient's blood pressure, pulse, and                            oxygen saturations were monitored continuously. The                            Olympus Scope [id number] was introduced through the                            anus and advanced to the the terminal ileum, with                            identification of the appendiceal orifice and IC                            valve. The colonoscopy was performed without                            difficulty. The patient tolerated the procedure                            well. The quality of the bowel preparation was                            good. The terminal ileum, ileocecal valve,                            appendiceal orifice, and rectum were photographed. Scope In: 10:58:49 AM Scope Out: 11:15:34 AM Scope Withdrawal Time: 0 hours 14 minutes 31 seconds  Total Procedure Duration: 0 hours 16 minutes 45 seconds  Findings:                 The perianal and digital rectal examinations were                            normal.  The terminal ileum appeared normal.                           A 3 mm polyp was found in the sigmoid colon. The                            polyp was sessile. The polyp was removed with a                            cold snare. Resection and retrieval were complete.                           The exam was otherwise without abnormality.                           Biopsies for histology were taken with a cold                            forceps from the right colon, left colon and                            transverse colon for evaluation of microscopic                            colitis. Complications:            No immediate complications. Estimated blood loss:                            Minimal. Estimated Blood Loss:     Estimated blood loss was minimal. Impression:               - The examined portion of the ileum was normal.                            - One 3 mm polyp in the sigmoid colon, removed with                            a cold snare. Resected and retrieved.                           - The examination was otherwise normal.                           - Biopsies were taken with a cold forceps from the                            right colon, left colon and transverse colon for                            evaluation of microscopic colitis.                           No obvious evidence of Crohn's disease or  ulcerative colitis, biopsies taken to assess for                            microscopic colitis. Recommendation:           - Patient has a contact number available for                            emergencies. The signs and symptoms of potential                            delayed complications were discussed with the                            patient. Return to normal activities tomorrow.                            Written discharge instructions were provided to the                            patient.                           - Resume previous diet.                           - Continue present medications.                           - Await pathology results.                           - Consideration for empiric trial of omeprazole                             20mg  / day for 30 days to see if it helps upper                            tract symptoms (had considered EGD, patient wanted                            to hold off for now), will discuss with the                            patient. Consideration for EGD if symptoms persist [name], MD 05/02/2023 11:23:38 AM This report has been signed electronically.

## 2023-05-02 NOTE — Progress Notes (Signed)
 Report given to PACU, vss

## 2023-05-02 NOTE — Patient Instructions (Addendum)
 Resume previous diet Continue present medications, pick up new Rx for Omeprazole , take 20 mg for 30 days, 30 minutes before eating, then after 30 days, let Dr Leigh know if you notice any improvement. Await pathology results  Handouts/information given for polyps  YOU HAD AN ENDOSCOPIC PROCEDURE TODAY AT THE [facility]:   Refer to the procedure report that was given to you for any specific questions about what was found during the examination.  If the procedure report does not answer your questions, please call your gastroenterologist to clarify.  If you requested that your care partner not be given the details of your procedure findings, then the procedure report has been included in a sealed envelope for you to review at your convenience later.  YOU SHOULD EXPECT: Some feelings of bloating in the abdomen. Passage of more gas than usual.  Walking can help get rid of the air that was put into your GI tract during the procedure and reduce the bloating. If you had a lower endoscopy (such as a colonoscopy or flexible sigmoidoscopy) you may notice spotting of blood in your stool or on the toilet paper. If you underwent a bowel prep for your procedure, you may not have a normal bowel movement for a few days.  Please Note:  You might notice some irritation and congestion in your nose or some drainage.  This is from the oxygen used during your procedure.  There is no need for concern and it should clear up in a day or so.  SYMPTOMS TO REPORT IMMEDIATELY:  Following lower endoscopy (colonoscopy):  Excessive amounts of blood in the stool  Significant tenderness or worsening of abdominal pains  Swelling of the abdomen that is new, acute  Fever of 100F or higher  For urgent or emergent issues, a gastroenterologist can be reached at any hour by calling [phone]. Do not use MyChart messaging for urgent concerns.   DIET:  We do recommend a small meal at first, but then you may  proceed to your regular diet.  Drink plenty of fluids but you should avoid alcoholic beverages for 24 hours.  ACTIVITY:  You should plan to take it easy for the rest of today and you should NOT DRIVE or use heavy machinery until tomorrow (because of the sedation medicines used during the test).    FOLLOW UP: Our staff will call the number listed on your records the next business day following your procedure.  We will call around 7:15- 8:00 am to check on you and address any questions or concerns that you may have regarding the information given to you following your procedure. If we do not reach you, we will leave a message.     If any biopsies were taken you will be contacted by phone or by letter within the next 1-3 weeks.  Please call us  at [phone] if you have not heard about the biopsies in 3 weeks.   SIGNATURES/CONFIDENTIALITY: You and/or your care partner have signed paperwork which will be entered into your electronic medical record.  These signatures attest to the fact that that the information above on your After Visit Summary has been reviewed and is understood.  Full responsibility of the confidentiality of this discharge information lies with you and/or your care-partner.

## 2023-05-02 NOTE — Progress Notes (Signed)
 [facility] History and Physical   Primary Care Physician:  Amy Norleen BROCKS, MD   Reason for Procedure:   Diarrhea, elevated fecal calprotectin  Plan:    colonoscopy     HPI: Amy Velasquez is a 28 y.o. female  here for colonoscopy to evaluate symptoms of diarrhea. Infectious workup negative. Fecal calprotectin at 142.  She has longstanding mixed IBS but in recent months mostly diarrhea. Also nausea, abdominal pains. Bentyl  helps some, as does Zofran . Sister dx with microscopic colitis recently.   Otherwise feels well without any cardiopulmonary symptoms.   I have discussed risks / benefits of anesthesia and endoscopic procedure with Amy Velasquez and they wish to proceed with the exams as outlined today.    Past Medical History:  Diagnosis Date   Anxiety 02/2005   Eczema    mild, prior history of   Pneumonia    age 36 hospitalization   Sinusitis 03/1995    Past Surgical History:  Procedure Laterality Date   MOUTH SURGERY     bite realignment, phrenectomy, orthodontics    Prior to Admission medications   Medication Sig Start Date End Date Taking? Authorizing Provider  clonazePAM (KLONOPIN) 0.5 MG tablet Take by mouth. 02/08/23  Yes [provider]  dicyclomine  (BENTYL ) 10 MG capsule Take 1 capsule (10 mg total) by mouth 3 (three) times daily as needed for spasms. 04/07/23  Yes Esterwood, Amy S, PA-Velasquez  escitalopram  (LEXAPRO ) 20 MG tablet TAKE 1 TABLET BY MOUTH EVERY DAY Patient taking differently: 30 mg. 09/25/19  Yes Lalonde, Amy C, MD  ondansetron  (ZOFRAN -ODT) 8 MG disintegrating tablet Take 1 tablet (8 mg total) by mouth every 8 (eight) hours as needed for nausea or vomiting. 04/02/23  Yes Amy Savannah, PA-Velasquez  TRI-SPRINTEC 0.18/0.215/0.25 MG-35 MCG tablet TAKE 1 TABLET BY MOUTH EVERY DAY 02/24/17  Yes Tysinger, Amy RAMAN, PA-Velasquez  bismuth subsalicylate (PEPTO BISMOL) 262 MG chewable tablet Chew 524 mg by mouth as needed.    [provider]  ibuprofen (ADVIL,MOTRIN) 200 MG tablet Take 200 mg by mouth every 6 (six) hours as needed.    [provider]  loperamide (IMODIUM) 1 MG/5ML solution Take by mouth as needed for diarrhea or loose stools.    [provider]  loperamide (IMODIUM) 2 MG capsule Take by mouth as needed for diarrhea or loose stools.    [provider]  zolpidem  (AMBIEN ) 5 MG tablet Take 1 tablet (5 mg total) by mouth at bedtime as needed for sleep. Patient not taking: Reported on 02/16/2023 07/23/21   Williams, Lynne B, PA-Velasquez    Current Outpatient Medications  Medication Sig Dispense Refill   clonazePAM (KLONOPIN) 0.5 MG tablet Take by mouth.     dicyclomine  (BENTYL ) 10 MG capsule Take 1 capsule (10 mg total) by mouth 3 (three) times daily as needed for spasms. 90 capsule 2   escitalopram  (LEXAPRO ) 20 MG tablet TAKE 1 TABLET BY MOUTH EVERY DAY (Patient taking differently: 30 mg.) 90 tablet 2   ondansetron  (ZOFRAN -ODT) 8 MG disintegrating tablet Take 1 tablet (8 mg total) by mouth every 8 (eight) hours as needed for nausea or vomiting. 20 tablet 0   TRI-SPRINTEC 0.18/0.215/0.25 MG-35 MCG tablet TAKE 1 TABLET BY MOUTH EVERY DAY 84 tablet 0   bismuth subsalicylate (PEPTO BISMOL) 262 MG chewable tablet Chew 524 mg by mouth as needed.     ibuprofen (ADVIL,MOTRIN) 200 MG tablet Take 200 mg by mouth every 6 (six) hours as needed.  loperamide (IMODIUM) 1 MG/5ML solution Take by mouth as needed for diarrhea or loose stools.     loperamide (IMODIUM) 2 MG capsule Take by mouth as needed for diarrhea or loose stools.     zolpidem  (AMBIEN ) 5 MG tablet Take 1 tablet (5 mg total) by mouth at bedtime as needed for sleep. (Patient not taking: Reported on 02/16/2023) 30 tablet 1   Current Facility-Administered Medications  Medication Dose Route Frequency Provider Last Rate Last Admin   0.9 %  sodium chloride  infusion  500 mL Intravenous Once Amy Velasquez, Amy SQUIBB, MD        Allergies as of 05/02/2023  - Review Complete 05/02/2023  Allergen Reaction Noted   Nickel Rash 04/02/2023    Family History  Problem Relation Age of Onset   Hyperlipidemia Father    Cancer Maternal Grandfather        lung   Cancer Paternal Grandmother        colon   Heart disease Neg Hx    Stroke Neg Hx    Colon cancer Neg Hx    Esophageal cancer Neg Hx    Stomach cancer Neg Hx    Rectal cancer Neg Hx     Social History   Socioeconomic History   Marital status: Single    Spouse name: Not on file   Number of children: Not on file   Years of education: Not on file   Highest education level: Not on file  Occupational History   Not on file  Tobacco Use   Smoking status: Never   Smokeless tobacco: Never  Vaping Use   Vaping status: Never Used  Substance and Sexual Activity   Alcohol use: Yes    Comment: 1-2 drinks/week during school year   Drug use: No   Sexual activity: Never    Birth control/protection: Pill  Other Topics Concern   Not on file  Social History Narrative   Exercising - Yoga, nature hikes      Social Drivers of Health   Financial Resource Strain: Low Risk  (02/16/2023)   Overall Financial Resource Strain (CARDIA)    Difficulty of Paying Living Expenses: Not very hard  Food Insecurity: No Food Insecurity (02/16/2023)   Hunger Vital Sign    Worried About Running Out of Food in the Last Year: Never true    Ran Out of Food in the Last Year: Never true  Transportation Needs: No Transportation Needs (02/16/2023)   PRAPARE - Administrator, Civil Service (Medical): No    Lack of Transportation (Non-Medical): No  Physical Activity: Sufficiently Active (02/16/2023)   Exercise Vital Sign    Days of Exercise per Week: 5 days    Minutes of Exercise per Session: 30 min  Stress: Stress Concern Present (02/16/2023)   [employer] of Occupational Health - Occupational Stress Questionnaire    Feeling of Stress : To some extent  Social Connections: Socially Integrated  (02/16/2023)   Social Connection and Isolation Panel [NHANES]    Frequency of Communication with Friends and Family: More than three times a week    Frequency of Social Gatherings with Friends and Family: More than three times a week    Attends Religious Services: 1 to 4 times per year    Active Member of [employer] or Organizations: Yes    Attends Banker Meetings: 1 to 4 times per year    Marital Status: Living with partner  Intimate Partner Violence: Not At Risk (02/16/2023)  Humiliation, Afraid, Rape, and Kick questionnaire    Fear of Current or Ex-Partner: No    Emotionally Abused: No    Physically Abused: No    Sexually Abused: No    Review of Systems: All other review of systems negative except as mentioned in the HPI.  Physical Exam: Vital signs BP 120/79   Pulse 67   Temp 98.6 F (37 Velasquez) (Temporal)   Resp 16   Ht 5' 7 (1.702 m)   Wt 130 lb (59 kg)   LMP 04/24/2023 (Exact Date)   SpO2 100%   BMI 20.36 kg/m   General:   Alert,  Well-developed, pleasant and cooperative in NAD Lungs:  Clear throughout to auscultation.   Heart:  Regular rate and rhythm Abdomen:  Soft, nontender and nondistended.   Neuro/Psych:  Alert and cooperative. Normal mood and affect. A and O x 3  Marcey Naval, MD [facility]

## 2023-05-02 NOTE — Progress Notes (Signed)
 Called to room to assist during endoscopic procedure.  Patient ID and intended procedure confirmed with present staff. Received instructions for my participation in the procedure from the performing physician.

## 2023-05-03 ENCOUNTER — Telehealth: Payer: Self-pay | Admitting: *Deleted

## 2023-05-03 NOTE — Telephone Encounter (Signed)
 Post procedure follow up call attempted.  No answer - LVM.

## 2023-05-04 LAB — SURGICAL PATHOLOGY

## 2023-05-05 ENCOUNTER — Encounter: Payer: Self-pay | Admitting: Gastroenterology

## 2023-05-05 ENCOUNTER — Ambulatory Visit: Payer: BC Managed Care – PPO | Admitting: Gastroenterology

## 2023-05-05 MED ORDER — COLESTIPOL HCL 1 G PO TABS: 1.0000 g | ORAL_TABLET | Freq: Two times a day (BID) | ORAL | 0 refills | Status: DC

## 2023-05-05 NOTE — Telephone Encounter (Signed)
 Medication warning - Administer estrogen-based oral contraceptives 4 hours prior to the administration of a bile acid sequestrant.  RX for Colestid sent to pharmacy on file. Patient notified of medication interaction with oral contraceptive.

## 2023-05-26 ENCOUNTER — Ambulatory Visit: Payer: BC Managed Care – PPO | Admitting: Nurse Practitioner

## 2023-06-10 ENCOUNTER — Other Ambulatory Visit: Payer: Self-pay

## 2023-06-10 MED ORDER — PANTOPRAZOLE SODIUM 20 MG PO TBEC: 20.0000 mg | DELAYED_RELEASE_TABLET | Freq: Every day | ORAL | 3 refills | Status: DC | PRN

## 2023-06-21 ENCOUNTER — Encounter: Payer: Self-pay | Admitting: Internal Medicine

## 2023-07-02 ENCOUNTER — Other Ambulatory Visit: Payer: Self-pay | Admitting: Physician Assistant

## 2023-07-06 ENCOUNTER — Ambulatory Visit: Payer: BC Managed Care – PPO | Admitting: Gastroenterology

## 2023-07-06 ENCOUNTER — Encounter: Payer: Self-pay | Admitting: Gastroenterology

## 2023-07-06 VITALS — BP 102/70 | HR 92 | Ht 67.0 in | Wt 138.0 lb

## 2023-07-06 DIAGNOSIS — R110 Nausea: Secondary | ICD-10-CM | POA: Diagnosis not present

## 2023-07-06 DIAGNOSIS — K580 Irritable bowel syndrome with diarrhea: Secondary | ICD-10-CM | POA: Diagnosis not present

## 2023-07-06 MED ORDER — PANTOPRAZOLE SODIUM 20 MG PO TBEC: 20.0000 mg | DELAYED_RELEASE_TABLET | Freq: Every day | ORAL | 1 refills | Status: AC | PRN

## 2023-07-06 NOTE — Patient Instructions (Addendum)
 We have sent the following medications to your pharmacy for you to pick up at your convenience: Protonix: Take once daily as needed and wean off as tolerated  Follow up as needed.  Thank you for entrusting me with your care and for choosing [facility], Dr. Ileene Patrick    If your blood pressure at your visit was 140/90 or greater, please contact your primary care physician to follow up on this. ______________________________________________________  If you are age 43 or older, your body mass index should be between 23-30. Your Body mass index is 21.61 kg/m. If this is out of the aforementioned range listed, please consider follow up with your Primary Care Provider.  If you are age 51 or younger, your body mass index should be between 19-25. Your Body mass index is 21.61 kg/m. If this is out of the aformentioned range listed, please consider follow up with your Primary Care Provider.  ________________________________________________________  The [facility] providers would like to encourage you to use [facility] to communicate with providers for non-urgent requests or questions.  Due to long hold times on the telephone, sending your provider a message by [facility] may be a faster and more efficient way to get a response.  Please allow 48 business hours for a response.  Please remember that this is for non-urgent requests.  _______________________________________________________  Due to recent changes in healthcare laws, you may see the results of your imaging and laboratory studies on MyChart before your provider has had a chance to review them.  We understand that in some cases there may be results that are confusing or concerning to you. Not all laboratory results come back in the same time frame and the provider may be waiting for multiple results in order to interpret others.  Please give [place] 48 hours in order for your provider to thoroughly review all the results before contacting  the office for clarification of your results.

## 2023-07-06 NOTE — Progress Notes (Signed)
 HPI :  28 year old female here for follow-up visit regarding her bowel symptoms and, nausea/indigestion.  She was seen by Mike Gip in December for symptoms that were bothering her which led to colonoscopy with me in January.  Recall she has a history of suspected irritable bowel syndrome, diarrhea predominant.  She had worsening of her symptoms a few months ago.  Recall her sister has a history of microscopic colitis.  She had an evaluation at the office to include negative GI pathogen panel, normal CRP, negative celiac testing.  She had a fecal calprotectin that was mildly elevated to the 140s.  Colonoscopy was done in January, results as outlined below.  No evidence of IBD on that exam.  Biopsies were negative for microscopic colitis.  At the time she had also endorsed frequent nausea in the morning and some occasional episodes of vomiting.  She was becoming anxious about eating which could sometimes make her symptoms worse and she was eating less food.  She really did not have much reflux but her stomach was upset usually in the morning and did not have much of an appetite.  We had discussed some options and recommended trial of omeprazole 20 mg daily.  She started this but called in regards to potential reaction to Lexapro.  We switched omeprazole to Protonix 20 mg daily and she has been taking that for the past few months.  She is feeling really well today.  Over the past few months she reports feeling so much better.  Her nausea and discomfort has mostly resolved.  She is no longer vomiting.  She states at the same time her bowel function has also significantly improved.  She previously was having multiple bowel movements per morning that were loose, now she states her bowels are regularly moving on a daily basis and no further diarrhea.  We had given her some dicyclomine which she took and states it helped on occasion but does not take routinely.  I had discussed giving her empiric Colestid  but she never needed to take it.  She is no longer taking Zofran either.  She took 1 dose of Pepto-Bismol since we have seen her.  Has not used any Imodium.  She thinks her stress levels play a large role in her bowel function.  She has been seeing a therapist more recently for her stress, currently engaged and planning a wedding and got a new promotion at her job.  She feels that now that her stomach has been feeling better that has decreased her stress worrying about that and she thinks related in regards to her bowel function improving.  Prior workup: December 2024  - celiac testing negative - CRP normal - negative GI pathogen panel - fecal calprotectin 142   Colonoscopy 05/02/2023: - The perianal and digital rectal examinations were normal. - The terminal ileum appeared normal. - A 3 mm polyp was found in the sigmoid colon. The polyp was sessile. The polyp was removed with a cold snare. Resection and retrieval were complete. - The exam was otherwise without abnormality. - Biopsies for histology were taken with a cold forceps from the right colon, left colon and transverse colon for evaluation of microscopic colitis.    1. Surgical [P], colon nos, random sites :       BENIGN COLONIC MUCOSA WITH NO DIAGNOSTIC ABNORMALITY        2. Surgical [P], colon, rectosigmoid, polyp (1) :       BENIGN INFLAMMATORY POLYP  NEGATIVE FOR DYSPLASIA AND CARCINOMA   Past Medical History:  Diagnosis Date   Anxiety 02/24/2005   Eczema    mild, prior history of   IBS (irritable bowel syndrome)    Pneumonia    age 46 hospitalization   Sinusitis 03/27/1995     Past Surgical History:  Procedure Laterality Date   MOUTH SURGERY     bite realignment, phrenectomy, orthodontics   Family History  Problem Relation Age of Onset   Hyperlipidemia Father    Cancer Maternal Grandfather        lung   Cancer Paternal Grandmother        colon   Heart disease Neg Hx    Stroke Neg Hx    Colon cancer Neg  Hx    Esophageal cancer Neg Hx    Stomach cancer Neg Hx    Rectal cancer Neg Hx    Social History   Tobacco Use   Smoking status: Never   Smokeless tobacco: Never  Vaping Use   Vaping status: Never Used  Substance Use Topics   Alcohol use: Yes    Comment: 1-2 drinks/week during school year   Drug use: No   Current Outpatient Medications  Medication Sig Dispense Refill   bismuth subsalicylate (PEPTO BISMOL) 262 MG chewable tablet Chew 524 mg by mouth as needed.     buPROPion (WELLBUTRIN XL) 150 MG 24 hr tablet      escitalopram (LEXAPRO) 20 MG tablet TAKE 1 TABLET BY MOUTH EVERY DAY (Patient taking differently: 30 mg.) 90 tablet 2   clonazePAM (KLONOPIN) 0.5 MG tablet Take by mouth.     colestipol (COLESTID) 1 g tablet Take 1 tablet (1 g total) by mouth 2 (two) times daily. 60 tablet 0   dicyclomine (BENTYL) 10 MG capsule TAKE 1 CAPSULE (10 MG TOTAL) BY MOUTH 3 (THREE) TIMES DAILY AS NEEDED FOR SPASMS. (Patient not taking: Reported on 07/06/2023) 270 capsule 0   ibuprofen (ADVIL,MOTRIN) 200 MG tablet Take 200 mg by mouth every 6 (six) hours as needed. (Patient not taking: Reported on 07/06/2023)     loperamide (IMODIUM) 1 MG/5ML solution Take by mouth as needed for diarrhea or loose stools.     loperamide (IMODIUM) 2 MG capsule Take by mouth as needed for diarrhea or loose stools.     omeprazole (PRILOSEC) 20 MG capsule Take 1 capsule (20 mg total) by mouth daily. 30 capsule 3   ondansetron (ZOFRAN-ODT) 8 MG disintegrating tablet Take 1 tablet (8 mg total) by mouth every 8 (eight) hours as needed for nausea or vomiting. (Patient not taking: Reported on 07/06/2023) 20 tablet 0   pantoprazole (PROTONIX) 20 MG tablet Take 1 tablet (20 mg total) by mouth daily as needed. 90 tablet 1   TRI-SPRINTEC 0.18/0.215/0.25 MG-35 MCG tablet TAKE 1 TABLET BY MOUTH EVERY DAY (Patient not taking: Reported on 07/06/2023) 84 tablet 0   No current facility-administered medications for this visit.    Allergies  Allergen Reactions   Nickel Rash     Review of Systems: All systems reviewed and negative except where noted in HPI.   Lab Results  Component Value Date   WBC 7.7 02/16/2023   HGB 12.7 02/16/2023   HCT 40.4 02/16/2023   MCV 86 02/16/2023   PLT 296 02/16/2023   Lab Results  Component Value Date   NA 139 02/16/2023   CL 102 02/16/2023   K 4.4 02/16/2023   CO2 22 02/16/2023   BUN 8 02/16/2023  CREATININE 0.94 02/16/2023   EGFR 84 02/16/2023   CALCIUM 9.8 02/16/2023   ALBUMIN 4.4 02/16/2023   GLUCOSE 81 02/16/2023    Lab Results  Component Value Date   ALT 34 (H) 02/16/2023   AST 23 02/16/2023   ALKPHOS 57 02/16/2023   BILITOT 0.3 02/16/2023     Physical Exam: BP 102/70   Pulse 92   Ht 5\' 7"  (1.702 m)   Wt 138 lb (62.6 kg)   BMI 21.61 kg/m  Constitutional: Pleasant,well-developed, female in no acute distress. Neurological: Alert and oriented to person place and time. Psychiatric: Normal mood and affect. Behavior is normal.   ASSESSMENT: 28 y.o. female here for assessment of the following  1. Nausea without vomiting   2. Irritable bowel syndrome with diarrhea    History and workup as outlined above.  She has had a pretty good workup with lab and stool testing and colonoscopy most recently.  She does not have any evidence of IBD or microscopic colitis, or celiac disease.  I do think IBS-D is the likely cause for her bowel symptoms.  She has been doing so much better since I've last seen her.  Recall she was having some nausea and indigestion frequently in the morning that was bothering her as well as the loose stools.  We placed on empiric trial of PPI.  Omeprazole can interact with Lexapro, switched to pantoprazole, continued low-dose 20 mg daily for the past few months and this has made her stomach feel much better.  She is eating without problems at this point, nausea has resolved.  Because of this she feels her stress and anxiety levels are  lower and her bowel function has improved because of that.  She is not requiring any medication for her bowels at this time and also seeing a therapist to help with her anxiety which she thinks is also helping.  Currently doing well.  We discussed long-term plan in regards to how long she wants to take PPI, long-term risks of chronic PPI use and plans moving forward.  We did discuss long-term risks of PPI, want to use lowest dose of medication needed possible to control symptoms.  I think reasonable to try and wean down her Protonix now that she has been on it for a few months and feeling better.  I recommend she try it every other day for a few weeks and if that goes well, to decrease to once every 3-day dosing or so.  If that continues to work well for her she can try stopping it and use it as needed.  We discussed in regards to some of the upper tract symptoms to screen her for H. pylori.  We could do a Diatherix stool test to make sure negative, discussed with that is.  She would prefer to hold off on that for now as she is feeling better, and see how she does off PPI.  If her symptoms recur she would be more willing to do that, let me know.  Otherwise can follow-up as needed moving forward for recurrent symptoms or if issues arise.  She agreed   PLAN: - going to try and wean down off protonix - can try every other day dosing and then slow wean as tolerated. If she does okay with this she can then use it PRN in the future.   - discussed long term risks/ benefits of chronic PPI use, if she does need chronic use - discussed H pylori testing - she  wants to hold off for now and consider it if symptoms recur - she feels stress has played a large role in bowel function, doing better in this regard. Fecal calpro was mildly elevated before, negative colonoscopy. Holding off on small bowel evaluation / imaging as long as she continues to do well without symptoms  Harlin Rain, MD [facility]  Gastroenterology

## 2023-07-28 ENCOUNTER — Ambulatory Visit: Admitting: Family Medicine

## 2023-07-28 ENCOUNTER — Encounter: Payer: Self-pay | Admitting: Family Medicine

## 2023-07-28 VITALS — BP 106/70 | Ht 67.0 in | Wt 140.0 lb

## 2023-07-28 DIAGNOSIS — M222X1 Patellofemoral disorders, right knee: Secondary | ICD-10-CM

## 2023-07-28 DIAGNOSIS — M25561 Pain in right knee: Secondary | ICD-10-CM

## 2023-07-28 NOTE — Addendum Note (Signed)
 Addended by: Andi Devon on: 07/28/2023 01:16 PM   Modules accepted: Level of Service

## 2023-07-28 NOTE — Progress Notes (Signed)
 PCP: Ronnald Nian, MD  Subjective:   HPI: Patient is a 28 y.o. female here for right knee pain.  Pain has been present for almost a week.  She reports she just recently started doing some home exercises.  She notices pain is worse when she is bending down into a deep squat going up stairs.  Pain is located on the anterior knee more specifically around the kneecap.  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  History of knee popping that is not painful.   Past Medical History:  Diagnosis Date   Anxiety 02/24/2005   Eczema    mild, prior history of   IBS (irritable bowel syndrome)    Pneumonia    age 69 hospitalization   Sinusitis 03/27/1995    Current Outpatient Medications on File Prior to Visit  Medication Sig Dispense Refill   buPROPion (WELLBUTRIN XL) 150 MG 24 hr tablet      escitalopram (LEXAPRO) 20 MG tablet TAKE 1 TABLET BY MOUTH EVERY DAY (Patient taking differently: 30 mg.) 90 tablet 2   pantoprazole (PROTONIX) 20 MG tablet Take 1 tablet (20 mg total) by mouth daily as needed. 90 tablet 1   TRI-SPRINTEC 0.18/0.215/0.25 MG-35 MCG tablet TAKE 1 TABLET BY MOUTH EVERY DAY 84 tablet 0   bismuth subsalicylate (PEPTO BISMOL) 262 MG chewable tablet Chew 524 mg by mouth as needed.     dicyclomine (BENTYL) 10 MG capsule TAKE 1 CAPSULE (10 MG TOTAL) BY MOUTH 3 (THREE) TIMES DAILY AS NEEDED FOR SPASMS. (Patient not taking: Reported on 07/28/2023) 270 capsule 0   No current facility-administered medications on file prior to visit.    Past Surgical History:  Procedure Laterality Date   MOUTH SURGERY     bite realignment, phrenectomy, orthodontics    Allergies  Allergen Reactions   Nickel Rash    BP 106/70   Ht 5\' 7"  (1.702 m)   Wt 140 lb (63.5 kg)   BMI 21.93 kg/m       No data to display              No data to display              Objective:  Physical Exam:  Gen: NAD, comfortable in exam room  No obvious deformity,  swelling, erythema No tenderness to palpation over the lateral and anterior joint line Tenderness to palpation over patella medial facet 5/5 strength in knee abductors and adductors Negative patellar grind test, negative patellar inhibition test   Assessment & Plan:  1.  Right knee pain: Most likely diagnosis is patellofemoral syndrome.  Patient to do home PT, rest, NSAIDs.  Follow-up as needed.

## 2023-11-22 ENCOUNTER — Ambulatory Visit: Admitting: Internal Medicine

## 2023-11-22 ENCOUNTER — Encounter: Payer: Self-pay | Admitting: Internal Medicine

## 2023-11-22 VITALS — BP 100/80 | HR 76 | Temp 98.3°F | Ht 67.0 in | Wt 140.0 lb

## 2023-11-22 DIAGNOSIS — R002 Palpitations: Secondary | ICD-10-CM | POA: Diagnosis not present

## 2023-11-22 NOTE — Patient Instructions (Signed)
 Your EKG is normal today. Likely these will go away in 1-2 weeks. If they are not going away or are worsening let us  know. We can order a heart monitor which is a sticker to wear on the chest for a few days to check these out.

## 2023-11-22 NOTE — Assessment & Plan Note (Signed)
 Suspect related to cold medications. EKG done which is stable from prior. She has stopped cold medication and caffeine. Asked her to monitor for 1-2 weeks and if improving/resolving okay to monitor. If worsening or not improving she will let us  know and we will order holter monitoring.

## 2023-11-22 NOTE — Progress Notes (Signed)
   Subjective:   Patient ID: Amy Velasquez, female    DOB: 06/17/1995, 28 y.o.   MRN: [id number]  HPI The patient is a 28 YO female coming in for palpitations since this weekend. She was taking dayquil/nyquil for a few days and stopped about 2-3 days ago. She denies cold symptoms any longer. Still having palpitations. Also stopped caffeine when palpitations started. Random and no chest pains. No SOB.   Review of Systems  Constitutional: Negative.   HENT: Negative.    Eyes: Negative.   Respiratory:  Negative for cough, chest tightness and shortness of breath.   Cardiovascular:  Positive for palpitations. Negative for chest pain and leg swelling.  Gastrointestinal:  Negative for abdominal distention, abdominal pain, constipation, diarrhea, nausea and vomiting.  Musculoskeletal: Negative.   Skin: Negative.   Neurological: Negative.   Psychiatric/Behavioral: Negative.      Objective:  Physical Exam Constitutional:      Appearance: She is well-developed.  HENT:     Head: Normocephalic and atraumatic.  Cardiovascular:     Rate and Rhythm: Normal rate and regular rhythm.  Pulmonary:     Effort: Pulmonary effort is normal. No respiratory distress.     Breath sounds: Normal breath sounds. No wheezing or rales.  Abdominal:     General: Bowel sounds are normal. There is no distension.     Palpations: Abdomen is soft.     Tenderness: There is no abdominal tenderness. There is no rebound.  Musculoskeletal:     Cervical back: Normal range of motion.  Skin:    General: Skin is warm and dry.  Neurological:     Mental Status: She is alert and oriented to person, place, and time.     Coordination: Coordination normal.     Vitals:   11/22/23 0939  BP: 100/80  Pulse: 76  Temp: 98.3 F (36.8 C)  TempSrc: Oral  SpO2: 97%  Weight: 140 lb (63.5 kg)  Height: 5' 7 (1.702 m)   EKG: Rate 73, axis normal, interval normal, sinus, no st or t wave changes, no significant change compared  to prior 2024  Assessment & Plan:

## 2023-12-12 ENCOUNTER — Encounter: Payer: Self-pay | Admitting: Internal Medicine

## 2023-12-12 DIAGNOSIS — R002 Palpitations: Secondary | ICD-10-CM

## 2023-12-13 ENCOUNTER — Ambulatory Visit: Attending: Internal Medicine

## 2023-12-13 DIAGNOSIS — R002 Palpitations: Secondary | ICD-10-CM

## 2023-12-13 NOTE — Progress Notes (Unsigned)
 EP to read.

## 2023-12-22 DIAGNOSIS — Z1379 Encounter for other screening for genetic and chromosomal anomalies: Secondary | ICD-10-CM | POA: Diagnosis not present

## 2023-12-22 DIAGNOSIS — Z01419 Encounter for gynecological examination (general) (routine) without abnormal findings: Secondary | ICD-10-CM | POA: Diagnosis not present

## 2023-12-22 DIAGNOSIS — Z6823 Body mass index (BMI) 23.0-23.9, adult: Secondary | ICD-10-CM | POA: Diagnosis not present

## 2023-12-22 DIAGNOSIS — R87610 Atypical squamous cells of undetermined significance on cytologic smear of cervix (ASC-US): Secondary | ICD-10-CM | POA: Diagnosis not present

## 2024-01-09 DIAGNOSIS — R002 Palpitations: Secondary | ICD-10-CM | POA: Diagnosis not present

## 2024-01-10 ENCOUNTER — Ambulatory Visit: Payer: Self-pay | Admitting: Internal Medicine

## 2024-01-10 DIAGNOSIS — R002 Palpitations: Secondary | ICD-10-CM | POA: Diagnosis not present

## 2024-03-29 ENCOUNTER — Telehealth: Payer: Self-pay | Admitting: Internal Medicine

## 2024-03-29 ENCOUNTER — Encounter: Payer: BC Managed Care – PPO | Admitting: Family Medicine

## 2024-03-29 NOTE — Telephone Encounter (Deleted)
 This patient no showed for their CPE appointment today.Which of the following is necessary for this patient.   A) No follow-up necessary   B) Follow-up urgent. Locate Patient Immediately.   C) Follow-up necessary. Contact patient and Schedule visit in ____ Days.   D) Follow-up Advised. Contact patient and Schedule visit in ____ Days.  E) Please Send no show letter to patient. Charge no show fee if no show was a CPE.   Send your response to [employer]

## 2024-03-29 NOTE — Telephone Encounter (Signed)
 We usually only do this questions when they no show a CPE and then Rosina will send them a no show letter and charge them a $50 fee that they have to pay before are seen for their CPE again. This is suppose to help them remember to show next time to their visit

## 2024-03-29 NOTE — Telephone Encounter (Signed)
 This patient no showed for their CPE appointment today.Which of the following is necessary for this patient.   A) No follow-up necessary   B) Follow-up urgent. Locate Patient Immediately.   C) Follow-up necessary. Contact patient and Schedule visit in ____ Days.   D) Follow-up Advised. Contact patient and Schedule visit in ____ Days.  E) Please Send no show letter to patient. Charge no show fee if no show was a CPE.    Send responses to [employer]

## 2024-04-13 ENCOUNTER — Encounter: Payer: Self-pay | Admitting: Family Medicine
